# Patient Record
Sex: Female | Born: 1980 | Race: White | Hispanic: Yes | Smoking: Never smoker
Health system: Southern US, Community
[De-identification: ages and names within clinical notes are randomized; demographics above are authoritative.]

## PROBLEM LIST (undated history)

## (undated) HISTORY — PX: TUBAL LIGATION: SHX77

---
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
## 1998-01-07 ENCOUNTER — Other Ambulatory Visit: Admission: RE | Admit: 1998-01-07 | Discharge: 1998-01-07 | Payer: Self-pay | Admitting: Obstetrics and Gynecology

## 2000-09-12 ENCOUNTER — Other Ambulatory Visit: Admission: RE | Admit: 2000-09-12 | Discharge: 2000-09-12 | Payer: Self-pay | Admitting: Gynecology

## 2015-07-09 ENCOUNTER — Emergency Department
Admission: EM | Admit: 2015-07-09 | Discharge: 2015-07-09 | Disposition: A | Discharge status: Home / Self Care | Payer: BLUE CROSS/BLUE SHIELD | Source: Home / Self Care | Attending: Family Medicine | Admitting: Family Medicine

## 2015-07-09 ENCOUNTER — Encounter: Payer: Self-pay | Admitting: *Deleted

## 2015-07-09 DIAGNOSIS — R059 Cough, unspecified: Secondary | ICD-10-CM

## 2015-07-09 DIAGNOSIS — J019 Acute sinusitis, unspecified: Secondary | ICD-10-CM | POA: Diagnosis not present

## 2015-07-09 DIAGNOSIS — R05 Cough: Secondary | ICD-10-CM

## 2015-07-09 MED ORDER — AMOXICILLIN-POT CLAVULANATE 875-125 MG PO TABS
1.0000 | ORAL_TABLET | Freq: Two times a day (BID) | ORAL | Status: DC
Start: 2015-07-09 — End: 2015-11-20

## 2015-07-09 MED ORDER — BENZONATATE 100 MG PO CAPS
100.0000 mg | ORAL_CAPSULE | Freq: Three times a day (TID) | ORAL | Status: DC
Start: 2015-07-09 — End: 2015-11-20

## 2015-07-09 NOTE — Discharge Instructions (Signed)
You may take 400-600mg  Ibuprofen (Motrin) every 6-8 hours for fever and pain  Alternate with Tylenol  You may take 500mg  Tylenol every 4-6 hours as needed for fever and pain  Follow-up with your primary care provider next week for recheck of symptoms if not improving.  Be sure to drink plenty of fluids and rest, at least 8hrs of sleep a night, preferably more while you are sick. Return urgent care or go to closest ER if you cannot keep down fluids/signs of dehydration, fever not reducing with Tylenol, difficulty breathing/wheezing, stiff neck, worsening condition, or other concerns (see below)  Please take antibiotics as prescribed and be sure to complete entire course even if you start to feel better to ensure infection does not come back.   Cool Mist Vaporizers Vaporizers may help relieve the symptoms of a cough and cold. They add moisture to the air, which helps mucus to become thinner and less sticky. This makes it easier to breathe and cough up secretions. Cool mist vaporizers do not cause serious burns like hot mist vaporizers, which may also be called steamers or humidifiers. Vaporizers have not been proven to help with colds. You should not use a vaporizer if you are allergic to mold. HOME CARE INSTRUCTIONS  Follow the package instructions for the vaporizer.  Do not use anything other than distilled water in the vaporizer.  Do not run the vaporizer all of the time. This can cause mold or bacteria to grow in the vaporizer.  Clean the vaporizer after each time it is used.  Clean and dry the vaporizer well before storing it.  Stop using the vaporizer if worsening respiratory symptoms develop.   This information is not intended to replace advice given to you by your health care provider. Make sure you discuss any questions you have with your health care provider.   Document Released: 12/10/2003 Document Revised: 03/19/2013 Document Reviewed: 08/01/2012 Elsevier Interactive Patient  Education 2016 Else[employer]  Cough, Adult A cough helps to clear your throat and lungs. A cough may last only 2-3 weeks (acute), or it may last longer than 8 weeks (chronic). Many different things can cause a cough. A cough may be a sign of an illness or another medical condition. HOME CARE  Pay attention to any changes in your cough.  Take medicines only as told by your doctor.  If you were prescribed an antibiotic medicine, take it as told by your doctor. Do not stop taking it even if you start to feel better.  Talk with your doctor before you try using a cough medicine.  Drink enough fluid to keep your pee (urine) clear or pale yellow.  If the air is dry, use a cold steam vaporizer or humidifier in your home.  Stay away from things that make you cough at work or at home.  If your cough is worse at night, try using extra pillows to raise your head up higher while you sleep.  Do not smoke, and try not to be around smoke. If you need help quitting, ask your doctor.  Do not have caffeine.  Do not drink alcohol.  Rest as needed. GET HELP IF:  You have new problems (symptoms).  You cough up yellow fluid (pus).  Your cough does not get better after 2-3 weeks, or your cough gets worse.  Medicine does not help your cough and you are not sleeping well.  You have pain that gets worse or pain that is not helped with medicine.  You have a fever.  You are losing weight and you do not know why.  You have night sweats. GET HELP RIGHT AWAY IF:  You cough up blood.  You have trouble breathing.  Your heartbeat is very fast.   This information is not intended to replace advice given to you by your health care provider. Make sure you discuss any questions you have with your health care provider.   Document Released: 11/25/2010 Document Revised: 12/03/2014 Document Reviewed: 05/21/2014 Elsevier Interactive Patient Education [employer].

## 2015-07-09 NOTE — ED Notes (Signed)
Pt c/o 1 week of dry cough, sinus pressure and ear pain. Afebrile. Taken Advil and Theraflu otc.

## 2015-07-09 NOTE — ED Provider Notes (Signed)
CSN: [id number]     Arrival date & time 07/09/15  1340 History   First MD Initiated Contact with Patient 07/09/15 1348     Chief Complaint  Patient presents with  . Cough  . Sinus Problem   (Consider location/radiation/quality/duration/timing/severity/associated sxs/prior Treatment) HPI  The pt is a 35yo female presenting to [facility] with c/o gradually worsening sinus pain and pressure with sinus congestion, Right ear pain, and dry cough that is moderate in severity, worse at night.  She has been taking Advil and Theraflu with only minimal relief.  Pt is off on Spring Break, trying to rest but not getting better. Denies fever, chills, n/v/d. No sick contacts or recent travel. Denies SOB. No hx of asthma.   History reviewed. No pertinent past medical history. Past Surgical History  Procedure Laterality Date  . Tubal ligation     Family History  Problem Relation Age of Onset  . Cancer Mother     cervical CA   Social History  Substance Use Topics  . Smoking status: Never Smoker   . Smokeless tobacco: Never Used  . Alcohol Use: Yes   OB History    No data available     Review of Systems  Constitutional: Positive for fatigue. Negative for fever and chills.  HENT: Positive for congestion, ear pain ( Right), postnasal drip, rhinorrhea, sinus pressure and sore throat. Negative for trouble swallowing and voice change.   Respiratory: Positive for cough. Negative for shortness of breath.   Cardiovascular: Negative for chest pain and palpitations.  Gastrointestinal: Negative for nausea, vomiting, abdominal pain and diarrhea.  Musculoskeletal: Negative for myalgias, back pain and arthralgias.  Skin: Negative for rash.  Neurological: Positive for headaches. Negative for dizziness and light-headedness.    Allergies  Codeine and Sulfur  Home Medications   Prior to Admission medications   Medication Sig Start Date End Date Taking? Authorizing Provider  amoxicillin-clavulanate (AUGMENTIN)  875-125 MG tablet Take 1 tablet by mouth 2 (two) times daily. One po bid x 7 days 07/09/15   Noland Fordyce, PA-C  benzonatate (TESSALON) 100 MG capsule Take 1-2 capsules (100-200 mg total) by mouth every 8 (eight) hours. 07/09/15   Noland Fordyce, PA-C   Meds Ordered and Administered this Visit  Medications - No data to display  BP 151/97 mmHg  Pulse 88  Temp(Src) 97.5 F (36.4 C) (Oral)  Resp 18  Ht 5' (1.524 m)  Wt 199 lb (90.266 kg)  BMI 38.86 kg/m2  SpO2 100%  LMP 06/02/2015 No data found.   Physical Exam  Constitutional: She appears well-developed and well-nourished. No distress.  HENT:  Head: Normocephalic and atraumatic.  Right Ear: Tympanic membrane is injected. A middle ear effusion is present.  Left Ear: Tympanic membrane normal.  Nose: Mucosal edema present. Right sinus exhibits maxillary sinus tenderness and frontal sinus tenderness. Left sinus exhibits maxillary sinus tenderness and frontal sinus tenderness.  Mouth/Throat: Uvula is midline, oropharynx is clear and moist and mucous membranes are normal.  Eyes: Conjunctivae are normal. No scleral icterus.  Neck: Normal range of motion. Neck supple.  Hoarse voice, no stridor.  Cardiovascular: Normal rate, regular rhythm and normal heart sounds.   Pulmonary/Chest: Effort normal and breath sounds normal. No stridor. No respiratory distress. She has no wheezes. She has no rales.  Abdominal: Soft. She exhibits no distension. There is no tenderness.  Musculoskeletal: Normal range of motion.  Lymphadenopathy:    She has no cervical adenopathy.  Neurological: She is alert.  Skin:  Skin is warm and dry. She is not diaphoretic.  Nursing note and vitals reviewed.   ED Course  Procedures (including critical care time)  Labs Review Labs Reviewed - No data to display  Imaging Review No results found.     MDM   1. Acute rhinosinusitis   2. Cough    Pt c/o worsening sinus symptoms despite rest and OTC medications.  Sinus tenderness noted on exam with hoarse voice and nasal congestion.  Will treat for bacterial cause.  Rx: Augmentin and tessalon  Advised pt to use acetaminophen and ibuprofen as needed for fever and pain. Encouraged rest and fluids. Encouraged sinus rinses.  F/u with PCP in 7-10 days if not improving, sooner if worsening. Pt verbalized understanding and agreement with tx plan.     Noland Fordyce, PA-C 07/09/15 [phone]

## 2015-10-23 ENCOUNTER — Ambulatory Visit (INDEPENDENT_AMBULATORY_CARE_PROVIDER_SITE_OTHER): Payer: BLUE CROSS/BLUE SHIELD | Admitting: Sports Medicine

## 2015-10-23 ENCOUNTER — Encounter: Payer: Self-pay | Admitting: Sports Medicine

## 2015-10-23 DIAGNOSIS — E282 Polycystic ovarian syndrome: Secondary | ICD-10-CM | POA: Diagnosis not present

## 2015-10-23 DIAGNOSIS — Z0000 Encounter for general adult medical examination without abnormal findings: Secondary | ICD-10-CM

## 2015-10-23 DIAGNOSIS — R635 Abnormal weight gain: Secondary | ICD-10-CM | POA: Diagnosis not present

## 2015-10-23 DIAGNOSIS — E669 Obesity, unspecified: Secondary | ICD-10-CM | POA: Insufficient documentation

## 2015-10-23 MED ORDER — METFORMIN HCL ER 500 MG PO TB24: 500.0000 mg | ORAL_TABLET | Freq: Every day | ORAL | 11 refills | Status: DC

## 2015-10-23 MED ORDER — PHENTERMINE HCL 37.5 MG PO TABS: ORAL_TABLET | ORAL | 0 refills | Status: DC

## 2015-10-23 NOTE — Assessment & Plan Note (Signed)
Starting metformin 500 extended release, checking testosterone levels, we will recheck in one month.

## 2015-10-23 NOTE — Progress Notes (Signed)
  Subjective:    CC: Establish care.   HPI:  This is a pleasant 35 year old female with PCOS, she has no complaints with the exception of difficulty losing weight.  Obesity: Desires to start weight loss medication.  Past medical history, Surgical history, Family history not pertinant except as noted below, Social history, Allergies, and medications have been entered into the medical record, reviewed, and no changes needed.   Review of Systems: No headache, visual changes, nausea, vomiting, diarrhea, constipation, dizziness, abdominal pain, skin rash, fevers, chills, night sweats, swollen lymph nodes, weight loss, chest pain, body aches, joint swelling, muscle aches, shortness of breath, mood changes, visual or auditory hallucinations.  Objective:    General: Well Developed, well nourished, and in no acute distress.  Neuro: Alert and oriented x3, extra-ocular muscles intact, sensation grossly intact.  HEENT: Normocephalic, atraumatic, pupils equal round reactive to light, neck supple, no masses, no lymphadenopathy, thyroid nonpalpable.  Skin: Warm and dry, no rashes noted.  Cardiac: Regular rate and rhythm, no murmurs rubs or gallops.  Respiratory: Clear to auscultation bilaterally. Not using accessory muscles, speaking in full sentences.  Abdominal: Soft, nontender, nondistended, positive bowel sounds, no masses, no organomegaly.  Musculoskeletal: Shoulder, elbow, wrist, hip, knee, ankle stable, and with full range of motion.  Impression and Recommendations:    The patient was counselled, risk factors were discussed, anticipatory guidance given.  Annual physical exam Up-to-date on screening measures, recent blood work including lipid panel, TSH were normal. She was a bit microcytic, recently had an IUD placed, I'm going to add a bit of iron for the next few months.  Abnormal weight gain Starting phentermine, nutrition referral, return monthly for weight checks and refills.  PCOS  (polycystic ovarian syndrome) Starting metformin 500 extended release, checking testosterone levels, we will recheck in one month.

## 2015-10-23 NOTE — Assessment & Plan Note (Signed)
Up-to-date on screening measures, recent blood work including lipid panel, TSH were normal. She was a bit microcytic, recently had an IUD placed, I'm going to add a bit of iron for the next few months.

## 2015-10-23 NOTE — Assessment & Plan Note (Signed)
Starting phentermine, nutrition referral, return monthly for weight checks and refills.

## 2015-10-24 LAB — TESTOSTERONE: Testosterone: 44 ng/dL

## 2015-10-27 ENCOUNTER — Ambulatory Visit (INDEPENDENT_AMBULATORY_CARE_PROVIDER_SITE_OTHER): Payer: BLUE CROSS/BLUE SHIELD | Admitting: Sports Medicine

## 2015-10-27 ENCOUNTER — Encounter: Payer: Self-pay | Admitting: Sports Medicine

## 2015-10-27 DIAGNOSIS — L918 Other hypertrophic disorders of the skin: Secondary | ICD-10-CM | POA: Diagnosis not present

## 2015-10-27 NOTE — Assessment & Plan Note (Signed)
Large skin tag on the back was treated with cryotherapy, return in 2 weeks to consider repeat treatment.

## 2015-10-27 NOTE — Progress Notes (Signed)
  Subjective:    CC: Skin tag  HPI: This is a pleasant 35 year old female with a skin tag in the middle of her back, she desires that we remove it.  Past medical history, Surgical history, Family history not pertinant except as noted below, Social history, Allergies, and medications have been entered into the medical record, reviewed, and no changes needed.   Review of Systems: No fevers, chills, night sweats, weight loss, chest pain, or shortness of breath.   Objective:    General: Well Developed, well nourished, and in no acute distress.  Neuro: Alert and oriented x3, extra-ocular muscles intact, sensation grossly intact.  HEENT: Normocephalic, atraumatic, pupils equal round reactive to light, neck supple, no masses, no lymphadenopathy, thyroid nonpalpable.  Skin: Warm and dry, no rashes. Large skin tag in the low back Cardiac: Regular rate and rhythm, no murmurs rubs or gallops, no lower extremity edema.  Respiratory: Clear to auscultation bilaterally. Not using accessory muscles, speaking in full sentences.  Procedure:  Cryodestruction of low back skin tag Consent obtained and verified. Time-out conducted. Noted no overlying erythema, induration, or other signs of local infection. Completed without difficulty using Cryo-Gun. Advised to call if fevers/chills, erythema, induration, drainage, or persistent bleeding.  Impression and Recommendations:    Skin tag Large skin tag on the back was treated with cryotherapy, return in 2 weeks to consider repeat treatment.

## 2015-11-20 ENCOUNTER — Ambulatory Visit (INDEPENDENT_AMBULATORY_CARE_PROVIDER_SITE_OTHER): Payer: BLUE CROSS/BLUE SHIELD | Admitting: Sports Medicine

## 2015-11-20 ENCOUNTER — Encounter: Payer: Self-pay | Admitting: Sports Medicine

## 2015-11-20 DIAGNOSIS — R635 Abnormal weight gain: Secondary | ICD-10-CM | POA: Diagnosis not present

## 2015-11-20 MED ORDER — PHENTERMINE HCL 37.5 MG PO TABS: ORAL_TABLET | ORAL | 0 refills | Status: DC

## 2015-11-20 NOTE — Progress Notes (Signed)
  Subjective:    CC: Follow-up  HPI: Obesity: 6 pound weight loss after the first month on phentermine. She does note some weaning of effect at the end of the day but is unable to remove her to take her medicine later in the day, and when she breaks it in half she cannot take the second dose.  Skin tag: Some scarring at the cryotherapy site which will resolve.  Past medical history, Surgical history, Family history not pertinant except as noted below, Social history, Allergies, and medications have been entered into the medical record, reviewed, and no changes needed.   Review of Systems: No fevers, chills, night sweats, weight loss, chest pain, or shortness of breath.   Objective:    General: Well Developed, well nourished, and in no acute distress.  Neuro: Alert and oriented x3, extra-ocular muscles intact, sensation grossly intact.  HEENT: Normocephalic, atraumatic, pupils equal round reactive to light, neck supple, no masses, no lymphadenopathy, thyroid nonpalpable.  Skin: Warm and dry, no rashes. Cardiac: Regular rate and rhythm, no murmurs rubs or gallops, no lower extremity edema.  Respiratory: Clear to auscultation bilaterally. Not using accessory muscles, speaking in full sentences.  Impression and Recommendations:    Abnormal weight gain 7 pound weight loss after the first month, refilling medication.

## 2015-11-20 NOTE — Assessment & Plan Note (Signed)
7 pound weight loss after the first month, refilling medication.

## 2015-12-18 ENCOUNTER — Ambulatory Visit (INDEPENDENT_AMBULATORY_CARE_PROVIDER_SITE_OTHER): Payer: BLUE CROSS/BLUE SHIELD | Admitting: Sports Medicine

## 2015-12-18 ENCOUNTER — Encounter: Payer: Self-pay | Admitting: Sports Medicine

## 2015-12-18 DIAGNOSIS — R635 Abnormal weight gain: Secondary | ICD-10-CM | POA: Diagnosis not present

## 2015-12-18 DIAGNOSIS — I10 Essential (primary) hypertension: Secondary | ICD-10-CM | POA: Diagnosis not present

## 2015-12-18 MED ORDER — PHENTERMINE HCL 37.5 MG PO TABS: ORAL_TABLET | ORAL | 0 refills | Status: DC

## 2015-12-18 MED ORDER — LISINOPRIL 10 MG PO TABS: 10.0000 mg | ORAL_TABLET | Freq: Every day | ORAL | 3 refills | Status: DC

## 2015-12-18 NOTE — Assessment & Plan Note (Signed)
3 pound weight loss after the second month. Refilling medication, if she doesn't lose at least 5 more pounds we will add Topamax.

## 2015-12-18 NOTE — Assessment & Plan Note (Signed)
Starting lisinopril 10.

## 2015-12-18 NOTE — Progress Notes (Signed)
  Subjective:    CC: Follow-up  HPI: Abnormal weight gain: Only an additional 3 pound weight loss at the second month of phentermine. Agreeable to use Topamax if she doesn't lose sufficient weight by the next visit.  Benign central hypertension: Elevated on every 6 visit, no headaches, visual changes, chest pain, agreeable to start medication.  Past medical history:  Negative.  See flowsheet/record as well for more information.  Surgical history: Negative.  See flowsheet/record as well for more information.  Family history: Negative.  See flowsheet/record as well for more information.  Social history: Negative.  See flowsheet/record as well for more information.  Allergies, and medications have been entered into the medical record, reviewed, and no changes needed.   Review of Systems: No fevers, chills, night sweats, weight loss, chest pain, or shortness of breath.   Objective:    General: Well Developed, well nourished, and in no acute distress.  Neuro: Alert and oriented x3, extra-ocular muscles intact, sensation grossly intact.  HEENT: Normocephalic, atraumatic, pupils equal round reactive to light, neck supple, no masses, no lymphadenopathy, thyroid nonpalpable.  Skin: Warm and dry, no rashes. Cardiac: Regular rate and rhythm, no murmurs rubs or gallops, no lower extremity edema.  Respiratory: Clear to auscultation bilaterally. Not using accessory muscles, speaking in full sentences.  Impression and Recommendations:    Abnormal weight gain 3 pound weight loss after the second month. Refilling medication, if she doesn't lose at least 5 more pounds we will add Topamax.  Essential hypertension, benign Starting lisinopril 10.

## 2016-01-15 ENCOUNTER — Ambulatory Visit: Payer: Self-pay | Admitting: Sports Medicine

## 2016-02-03 ENCOUNTER — Ambulatory Visit (INDEPENDENT_AMBULATORY_CARE_PROVIDER_SITE_OTHER): Payer: BLUE CROSS/BLUE SHIELD | Admitting: Sports Medicine

## 2016-02-03 ENCOUNTER — Encounter: Payer: Self-pay | Admitting: Sports Medicine

## 2016-02-03 DIAGNOSIS — I10 Essential (primary) hypertension: Secondary | ICD-10-CM

## 2016-02-03 DIAGNOSIS — R635 Abnormal weight gain: Secondary | ICD-10-CM

## 2016-02-03 MED ORDER — PHENTERMINE HCL 37.5 MG PO TABS: ORAL_TABLET | ORAL | 0 refills | Status: DC

## 2016-02-03 MED ORDER — TOPIRAMATE 50 MG PO TABS: ORAL_TABLET | ORAL | 0 refills | Status: DC

## 2016-02-03 NOTE — Progress Notes (Signed)
  Subjective:    CC: Follow-up  HPI: Obesity: Loss to few pounds, agreeable to add Topamax.  Benign essential hypertension: Controlled with lisinopril 10  Past medical history:  Negative.  See flowsheet/record as well for more information.  Surgical history: Negative.  See flowsheet/record as well for more information.  Family history: Negative.  See flowsheet/record as well for more information.  Social history: Negative.  See flowsheet/record as well for more information.  Allergies, and medications have been entered into the medical record, reviewed, and no changes needed.   Review of Systems: No fevers, chills, night sweats, weight loss, chest pain, or shortness of breath.   Objective:    General: Well Developed, well nourished, and in no acute distress.  Neuro: Alert and oriented x3, extra-ocular muscles intact, sensation grossly intact.  HEENT: Normocephalic, atraumatic, pupils equal round reactive to light, neck supple, no masses, no lymphadenopathy, thyroid nonpalpable.  Skin: Warm and dry, no rashes. Cardiac: Regular rate and rhythm, no murmurs rubs or gallops, no lower extremity edema.  Respiratory: Clear to auscultation bilaterally. Not using accessory muscles, speaking in full sentences.  Impression and Recommendations:    Essential hypertension, benign Well-controlled on lisinopril 10.  Abnormal weight gain Only a couple additional pounds after the third month, adding Topamax.   I spent 25 minutes with this patient, greater than 50% was face-to-face time counseling regarding the above diagnoses

## 2016-02-03 NOTE — Assessment & Plan Note (Signed)
Only a couple additional pounds after the third month, adding Topamax.

## 2016-02-03 NOTE — Assessment & Plan Note (Signed)
Well controlled on lisinopril 10

## 2016-02-09 ENCOUNTER — Telehealth: Payer: Self-pay

## 2016-02-09 NOTE — Telephone Encounter (Signed)
Rose Brooks states she was seen last week is still sick. She has pressure in face, nasal congestion, headaches, cough, fever and chills. She was treated with amoxicillin in September. She is taking Flonase and Theraflu. Please advise.

## 2016-02-10 NOTE — Telephone Encounter (Signed)
Where was she treated with the amoxicillin? What is one of the urgent cares? I probably need to take another look at her specifically to reevaluate her symptoms.

## 2016-02-11 NOTE — Telephone Encounter (Signed)
Left message advising of recommendations.  

## 2016-03-04 ENCOUNTER — Ambulatory Visit (INDEPENDENT_AMBULATORY_CARE_PROVIDER_SITE_OTHER): Payer: BLUE CROSS/BLUE SHIELD | Admitting: Sports Medicine

## 2016-03-04 ENCOUNTER — Encounter: Payer: Self-pay | Admitting: Sports Medicine

## 2016-03-04 DIAGNOSIS — R635 Abnormal weight gain: Secondary | ICD-10-CM

## 2016-03-04 MED ORDER — TOPIRAMATE 50 MG PO TABS: 50.0000 mg | ORAL_TABLET | Freq: Every day | ORAL | 0 refills | Status: DC

## 2016-03-04 MED ORDER — PHENTERMINE HCL 37.5 MG PO TABS: ORAL_TABLET | ORAL | 0 refills | Status: DC

## 2016-03-04 NOTE — Progress Notes (Signed)
  Subjective:    CC: Follow-up  HPI: This pleasant 35 year old female had somewhat stalled out on her weight loss, we added Topamax and she has dropped an additional 10 pounds.  Past medical history:  Negative.  See flowsheet/record as well for more information.  Surgical history: Negative.  See flowsheet/record as well for more information.  Family history: Negative.  See flowsheet/record as well for more information.  Social history: Negative.  See flowsheet/record as well for more information.  Allergies, and medications have been entered into the medical record, reviewed, and no changes needed.   Review of Systems: No fevers, chills, night sweats, weight loss, chest pain, or shortness of breath.   Objective:    General: Well Developed, well nourished, and in no acute distress.  Neuro: Alert and oriented x3, extra-ocular muscles intact, sensation grossly intact.  HEENT: Normocephalic, atraumatic, pupils equal round reactive to light, neck supple, no masses, no lymphadenopathy, thyroid nonpalpable.  Skin: Warm and dry, no rashes. Cardiac: Regular rate and rhythm, no murmurs rubs or gallops, no lower extremity edema.  Respiratory: Clear to auscultation bilaterally. Not using accessory muscles, speaking in full sentences.  Impression and Recommendations:    Abnormal weight gain Fantastic improvement in weight loss with 10 pounds after the last month simply with the addition of Topamax. Refilling phentermine and Topamax as we enter the fifth month.

## 2016-03-04 NOTE — Assessment & Plan Note (Signed)
Fantastic improvement in weight loss with 10 pounds after the last month simply with the addition of Topamax. Refilling phentermine and Topamax as we enter the fifth month.

## 2016-03-15 ENCOUNTER — Encounter: Payer: Self-pay | Admitting: Sports Medicine

## 2016-03-15 ENCOUNTER — Ambulatory Visit (INDEPENDENT_AMBULATORY_CARE_PROVIDER_SITE_OTHER): Payer: BLUE CROSS/BLUE SHIELD

## 2016-03-15 ENCOUNTER — Ambulatory Visit (INDEPENDENT_AMBULATORY_CARE_PROVIDER_SITE_OTHER): Payer: BLUE CROSS/BLUE SHIELD | Admitting: Sports Medicine

## 2016-03-15 DIAGNOSIS — J3089 Other allergic rhinitis: Secondary | ICD-10-CM | POA: Insufficient documentation

## 2016-03-15 DIAGNOSIS — M542 Cervicalgia: Secondary | ICD-10-CM

## 2016-03-15 MED ORDER — AZITHROMYCIN 250 MG PO TABS: ORAL_TABLET | ORAL | 0 refills | Status: DC

## 2016-03-15 MED ORDER — AZELASTINE HCL 0.1 % NA SOLN: 2.0000 | Freq: Two times a day (BID) | NASAL | 1 refills | Status: DC

## 2016-03-15 MED ORDER — FEXOFENADINE HCL 180 MG PO TABS: 180.0000 mg | ORAL_TABLET | Freq: Every day | ORAL | 3 refills | Status: DC

## 2016-03-15 NOTE — Progress Notes (Signed)
  Subjective:    CC: Sinus congestion  HPI: For the past several months this pleasant 35 year old female has had a right nose, and now new onset sinus pressure, she has significant postnasal drip with a sore throat. Symptoms are moderate, persistent. No fevers, chills, night sweats, weight loss, no GI symptoms, no rash, no lower respiratory symptoms.  She also has some neck pain, left trapezial.  Past medical history:  Negative.  See flowsheet/record as well for more information.  Surgical history: Negative.  See flowsheet/record as well for more information.  Family history: Negative.  See flowsheet/record as well for more information.  Social history: Negative.  See flowsheet/record as well for more information.  Allergies, and medications have been entered into the medical record, reviewed, and no changes needed.   Review of Systems: No fevers, chills, night sweats, weight loss, chest pain, or shortness of breath.   Objective:    General: Well Developed, well nourished, and in no acute distress.  Neuro: Alert and oriented x3, extra-ocular muscles intact, sensation grossly intact.  HEENT: Normocephalic, atraumatic, pupils equal round reactive to light, neck supple, no masses, no lymphadenopathy, thyroid nonpalpable. Oropharynx and ear canals are unremarkable, nasopharynx is boggy and erythematous turbinate. Skin: Warm and dry, no rashes. Cardiac: Regular rate and rhythm, no murmurs rubs or gallops, no lower extremity edema.  Respiratory: Clear to auscultation bilaterally. Not using accessory muscles, speaking in full sentences.  Impression and Recommendations:    Perennial allergic rhinitis Continue Flonase, adding nasal Azelastine, switched to fexofenadine and discontinue decongestants. She does have some frontal sinusitis at this point, adding azithromycin.  Neck pain Unilateral and left-sided across trapezius. There is likely an element of cervical spasm. Over-the-counter  analgesics, NSAIDs, and adding neck rehabilitation exercises as well as neck x-rays.  Return in 4 weeks for this.  I spent 25 minutes with this patient, greater than 50% was face-to-face time counseling regarding the above diagnoses

## 2016-03-15 NOTE — Assessment & Plan Note (Signed)
Continue Flonase, adding nasal Azelastine, switched to fexofenadine and discontinue decongestants. She does have some frontal sinusitis at this point, adding azithromycin.

## 2016-03-15 NOTE — Assessment & Plan Note (Signed)
Unilateral and left-sided across trapezius. There is likely an element of cervical spasm. Over-the-counter analgesics, NSAIDs, and adding neck rehabilitation exercises as well as neck x-rays.  Return in 4 weeks for this.

## 2016-04-01 ENCOUNTER — Ambulatory Visit (INDEPENDENT_AMBULATORY_CARE_PROVIDER_SITE_OTHER): Payer: BLUE CROSS/BLUE SHIELD | Admitting: Sports Medicine

## 2016-04-01 ENCOUNTER — Encounter: Payer: Self-pay | Admitting: Sports Medicine

## 2016-04-01 DIAGNOSIS — R635 Abnormal weight gain: Secondary | ICD-10-CM

## 2016-04-01 DIAGNOSIS — F4323 Adjustment disorder with mixed anxiety and depressed mood: Secondary | ICD-10-CM | POA: Diagnosis not present

## 2016-04-01 DIAGNOSIS — M542 Cervicalgia: Secondary | ICD-10-CM | POA: Diagnosis not present

## 2016-04-01 MED ORDER — BUPROPION HCL ER (XL) 150 MG PO TB24: 150.0000 mg | ORAL_TABLET | Freq: Every day | ORAL | 3 refills | Status: DC

## 2016-04-01 MED ORDER — MELOXICAM 15 MG PO TABS: ORAL_TABLET | ORAL | 3 refills | Status: DC

## 2016-04-01 MED ORDER — ALPRAZOLAM 0.5 MG PO TABS: 0.5000 mg | ORAL_TABLET | Freq: Two times a day (BID) | ORAL | 0 refills | Status: DC | PRN

## 2016-04-01 MED ORDER — PHENTERMINE HCL 37.5 MG PO TABS: ORAL_TABLET | ORAL | 0 refills | Status: DC

## 2016-04-01 MED ORDER — TOPIRAMATE 50 MG PO TABS: 50.0000 mg | ORAL_TABLET | Freq: Every day | ORAL | 0 refills | Status: DC

## 2016-04-01 MED ORDER — MELOXICAM 15 MG PO TABS
ORAL_TABLET | ORAL | 3 refills | Status: DC
Start: 2016-04-01 — End: 2016-06-29

## 2016-04-01 NOTE — Assessment & Plan Note (Signed)
Husband is leaving her. Tearful in the exam room, having episodes of panic. Adding Wellbutrin to assist with weight loss as well as depressive symptoms and alprazolam 0.5 mg, no more than 20 pills per month.

## 2016-04-01 NOTE — Progress Notes (Signed)
  Subjective:    CC: follow-up multiple issues  HPI: Abnormal weight gain: As we entered the sixth month of phentermine she has continued to lose weight.  Neck pain:  Agreeable to proceed with physical therapy and switch to meloxicam, pain is axial, nothing overtly radicular, no constitutional symptoms or trauma.  Adjustment disorder: Just found out that her husband leaving her, she now has expected anxiety and depressed mood, with severe depressed mood, severe difficulty staying asleep, poor appetite, moderate double to concentrating, severe psychomotor retardation, mild anhedonia, poor energy, guilt, no suicidal or homicidal ideation, edition she has severe nervousness, difficulty controlling her worry, worrying about different things, trouble relaxing, restlessness, irritability and fear of impending doom.  Past medical history:  Negative.  See flowsheet/record as well for more information.  Surgical history: Negative.  See flowsheet/record as well for more information.  Family history: Negative.  See flowsheet/record as well for more information.  Social history: Negative.  See flowsheet/record as well for more information.  Allergies, and medications have been entered into the medical record, reviewed, and no changes needed.   Review of Systems: No fevers, chills, night sweats, weight loss, chest pain, or shortness of breath.   Objective:    General: Well Developed, well nourished, and in no acute distress.  Neuro: Alert and oriented x3, extra-ocular muscles intact, sensation grossly intact.  HEENT: Normocephalic, atraumatic, pupils equal round reactive to light, neck supple, no masses, no lymphadenopathy, thyroid nonpalpable.  Skin: Warm and dry, no rashes. Cardiac: Regular rate and rhythm, no murmurs rubs or gallops, no lower extremity edema.  Respiratory: Clear to auscultation bilaterally. Not using accessory muscles, speaking in full sentences.  Impression and Recommendations:      Abnormal weight gain Good continued weight loss. We are entering the sixth month, continue phentermine, Topamax.  Neck pain Adding meloxicam and physical therapy. If no sufficient improvement in 2 months we will add an MRI.  Adjustment disorder with mixed anxiety and depressed mood Husband is leaving her. Tearful in the exam room, having episodes of panic. Adding Wellbutrin to assist with weight loss as well as depressive symptoms and alprazolam 0.5 mg, no more than 20 pills per month.

## 2016-04-01 NOTE — Assessment & Plan Note (Signed)
Adding meloxicam and physical therapy. If no sufficient improvement in 2 months we will add an MRI.

## 2016-04-01 NOTE — Assessment & Plan Note (Signed)
Good continued weight loss. We are entering the sixth month, continue phentermine, Topamax.

## 2016-04-06 ENCOUNTER — Encounter: Payer: Self-pay | Admitting: Physical Therapy

## 2016-04-06 ENCOUNTER — Ambulatory Visit (INDEPENDENT_AMBULATORY_CARE_PROVIDER_SITE_OTHER): Payer: BLUE CROSS/BLUE SHIELD | Admitting: Physical Therapy

## 2016-04-06 DIAGNOSIS — M6281 Muscle weakness (generalized): Secondary | ICD-10-CM

## 2016-04-06 DIAGNOSIS — G8929 Other chronic pain: Secondary | ICD-10-CM

## 2016-04-06 DIAGNOSIS — M542 Cervicalgia: Secondary | ICD-10-CM

## 2016-04-06 NOTE — Therapy (Addendum)
[place], [place], [address] Phone: [phone]   Fax:  [phone]  Physical Therapy Evaluation  Patient Details  Name: Rose Brooks MRN: [id number] Date of Birth: 1980/06/16 Referring Provider: Dr Dianah Field  Encounter Date: 04/06/2016      PT End of Session - 04/06/16 3614    Visit Number 1   Number of Visits 8  6-   Date for PT Re-Evaluation 05/04/16   PT Start Time 0713   PT Stop Time 0817   PT Time Calculation (min) 64 min   Activity Tolerance Patient tolerated treatment well      History reviewed. No pertinent past medical history.  Past Surgical History:  Procedure Laterality Date  . TUBAL LIGATION      There were no vitals filed for this visit.       Subjective Assessment - 04/06/16 0713    Subjective Pt reports she started having neck and back pain about a year ago.  Recently it has gotten worse and there are at least two times a week that she can't lie doen due to pain. She has been performing stretching and gentle exercise since before Christmas and hasn't seen any improvement.    Diagnostic tests x-ray - degenerative changes.    Patient Stated Goals limit meds she needs to be on, looking for stretches/exercise to help manage the pain and sleep better at night.    Currently in Pain? Yes   Pain Score 4    Pain Location Neck   Pain Orientation Left   Pain Descriptors / Indicators --  stiffness   Pain Type Chronic pain   Pain Radiating Towards starts in the shoulder blade area Lt and travels up the neck    Pain Onset More than a month ago   Pain Frequency Constant   Aggravating Factors  gets worse through the day,    Pain Relieving Factors heat            [facility] PT Assessment - 04/06/16 0001      Assessment   Medical Diagnosis Neck pain   Referring Provider Dr Dianah Field   Onset Date/Surgical Date 04/07/15   Hand Dominance Right   Next MD Visit 05/05/16    Prior Therapy no     Precautions   Precautions None     Balance Screen   Has the patient fallen in the past 6 months No     Home Environment   Additional Comments pt with a lot of stress, husband is leaving her     Prior Function   Level of Independence Independent   Vocation Full time employment   Vocation Requirements works at [employer], has a Counsellor, moves around   Leisure works with a Clinical research associate 2x/wk and to [employer] 2x/wk, hiking, fishing     Observation/Other Assessments   Focus on Therapeutic Outcomes (FOTO)  63% limited     Posture/Postural Control   Posture/Postural Control Postural limitations   Postural Limitations Rounded Shoulders;Forward head     ROM / Strength   AROM / PROM / Strength AROM;Strength     AROM   AROM Assessment Site Cervical;Shoulder   Right/Left Shoulder --  bilat WNL pain in Lt side neck   Cervical Flexion WNL with pain Lt side neck   Cervical Extension WNL pain Lt side neck   Cervical - Right Side Bend decreased 25% compared to Lt   Cervical - Left Side Bend WNL  Cervical - Right Rotation WNL pain Lt side   Cervical - Left Rotation WNL pain Lt side     Strength   Overall Strength Comments mid back Rt WNL, Lt 4/5   Strength Assessment Site Shoulder;Elbow   Right/Left Shoulder --  bilat WNL   Right/Left Elbow --  bilat WNL     Flexibility   Soft Tissue Assessment /Muscle Length --  tight pecs bilat     Palpation   Palpation comment tightness and multiple trigger points in Lt cervical , upper trap, levator and some into the rhomboids     Special Tests    Special Tests --  ( -) spurlings                    [facility] Adult PT Treatment/Exercise - 04/06/16 0001      Self-Care   Self-Care Other Self-Care Comments   Other Self-Care Comments  Pt educated on suboccipital release with balls, as well as self massage with ball to Lt shoulder girdle. Pt verbalized understanding and returned demo.      Exercises    Exercises Neck;Shoulder     Neck Exercises: Supine   Neck Retraction 10 reps;5 secs  (head press into pillow)      Shoulder Exercises: Supine   Horizontal ABduction Strengthening;Both;15 reps;Theraband   Theraband Level (Shoulder Horizontal ABduction) Level 3 (Green)     Shoulder Exercises: Standing   Other Standing Exercises scap squeeze x 5 sec x 5 reps.      Shoulder Exercises: Stretch   Other Shoulder Stretches Doorway stretch - low and mid level (unilateral) x 30 sec x 2 reps each arm, each position      Modalities   Modalities Electrical Stimulation;Moist Heat     Moist Heat Therapy   Number Minutes Moist Heat 15 Minutes   Moist Heat Location Shoulder;Cervical     Electrical Stimulation   Electrical Stimulation Location Lt cervical paraspinals/ Upper trap/levator   Electrical Stimulation Action IFC   Electrical Stimulation Parameters  to tolerance    Electrical Stimulation Goals Pain;Tone                PT Education - 04/06/16 0745    Education provided Yes   Education Details HEP, DN    Person(s) Educated Patient   Methods Demonstration;Explanation;Handout   Comprehension Returned demonstration;Verbalized understanding             PT Long Term Goals - 04/06/16 0746      PT LONG TERM GOAL #1   Title I with advanced HEP to include exercise at the gym ( 05/04/16)    Time 4   Period Weeks   Status New     PT LONG TERM GOAL #2   Title demo pain free cervical and shoulder ROM ( 05/04/16)    Time 4   Period Weeks   Status New     PT LONG TERM GOAL #3   Title demo mid back strength = bilat ( 05/04/16)    Time 4   Period Weeks   Status New     PT LONG TERM GOAL #4   Title report =/> 75% reduction in neck/thoracic pain to allow her to sleep per her previous level ( 05/04/16)    Time 4   Period Weeks   Status New     PT LONG TERM GOAL #5   Title improve FOTO =/< 43% limited ( 05/04/16)    Time 4   Period  Weeks   Status New                Plan - 04/06/16 0741    Clinical Impression Statement 36 yo female with long h/o neck and thoracic pain that has gotten worse over the last couple of months as her personal life has become more stressful.  She works out 4 times a week, two of those with a Physiological scientist.  She does have some postural changes that place stress on the neck/shoulder, multiple trigger points/muscle tightness and some Lt sided upper back weakness as well as painful movements.  She wishes to learn what she can and transition to a HEP as soon as possible.     Rehab Potential Good   PT Frequency 2x / week  for two weeks then 1-2x/wk for 2 more weeks   PT Treatment/Interventions Moist Heat;Traction;Ultrasound;Therapeutic exercise;Dry needling;Taping;Manual techniques;Neuromuscular re-education;Cryotherapy;Electrical Stimulation;Patient/family education;Passive range of motion   PT Next Visit Plan DN if pt is interested, progress posture correction ex, manual work, modalites PRN   Consulted and Agree with Plan of Care Patient      Patient will benefit from skilled therapeutic intervention in order to improve the following deficits and impairments:  Pain, Decreased strength, Increased muscle spasms  Visit Diagnosis: Chronic neck pain - Plan: PT plan of care cert/re-cert  Muscle weakness (generalized) - Plan: PT plan of care cert/re-cert     Problem List Patient Active Problem List   Diagnosis Date Noted  . Adjustment disorder with mixed anxiety and depressed mood 04/01/2016  . Perennial allergic rhinitis 03/15/2016  . Neck pain 03/15/2016  . Essential hypertension, benign 12/18/2015  . Annual physical exam 10/23/2015  . PCOS (polycystic ovarian syndrome) 10/23/2015  . Abnormal weight gain 10/23/2015    Jeral Pinch PT  04/06/2016, 9:01 AM  [facility] [place] [address], [place], [address] Phone: [phone]   Fax:  [phone]  Name:  Rose Brooks MRN: [id number] Date of Birth: 04-Oct-1980 PHYSICAL THERAPY DISCHARGE SUMMARY  Visits from Start of Care: 1  Current functional level related to goals / functional outcomes: unknown   Remaining deficits: unknown   Education / Equipment: Initial HEP Plan:                                                    Patient goals were not met. Patient is being discharged due to not returning since the last visit.  ?????     Jeral Pinch, PT 06/16/16 9:46 AM

## 2016-04-06 NOTE — Patient Instructions (Addendum)
Trigger Point Dry Needling  . What is Trigger Point Dry Needling (DN)? o DN is a physical therapy technique used to treat muscle pain and dysfunction. Specifically, DN helps deactivate muscle trigger points (muscle knots).  o A thin filiform needle is used to penetrate the skin and stimulate the underlying trigger point. The goal is for a local twitch response (LTR) to occur and for the trigger point to relax. No medication of any kind is injected during the procedure.   . What Does Trigger Point Dry Needling Feel Like?  o The procedure feels different for each individual patient. Some patients report that they do not actually feel the needle enter the skin and overall the process is not painful. Very mild bleeding may occur. However, many patients feel a deep cramping in the muscle in which the needle was inserted. This is the local twitch response.   Marland Kitchen How Will I feel after the treatment? o Soreness is normal, and the onset of soreness may not occur for a few hours. Typically this soreness does not last longer than two days.  o Bruising is uncommon, however; ice can be used to decrease any possible bruising.  o In rare cases feeling tired or nauseous after the treatment is normal. In addition, your symptoms may get worse before they get better, this period will typically not last longer than 24 hours.   . What Can I do After My Treatment? o Increase your hydration by drinking more water for the next 24 hours. o You may place ice or heat on the areas treated that have become sore, however, do not use heat on inflamed or bruised areas. Heat often brings more relief post needling. o You can continue your regular activities, but vigorous activity is not recommended initially after the treatment for 24 hours. o DN is best combined with other physical therapy such as strengthening, stretching, and other therapies.   Head Press With [place] chin SLIGHTLY toward chest, keep mouth closed.  Feel weight on back of head. Increase weight by pressing head down. Hold _5__ seconds. Relax. Repeat _10__ times.  Side Pull: Double Arm   On back, knees bent, feet flat. Arms perpendicular to body, shoulder level, elbows straight but relaxed. Pull arms out to sides, elbows straight. Resistance band comes across collarbones, hands toward floor. Hold momentarily. Slowly return to starting position. Repeat __10_ times, 2-3 sets, 3x/wk. Band color _green____   Scapula Adduction With Pectorals, Low   Stand in doorframe with palms against frame and arms at 45. Lean forward and squeeze shoulder blades. Hold _30__ seconds. Repeat __2_ times per session. Do _2-3__ sessions per day.   Scapula Adduction With Pectorals, Mid-Range   Stand in doorframe with palms against frame and arms at 90. Lean forward and squeeze shoulder blades. Hold _30__ seconds. Repeat _2__ times per session. Do _2-3__ sessions per day.  * self massage  With ball to shoulder blade area 3-5 min daily.  * occipital release - tennis balls (in panty hose) to back of neck/head x 1 min, work up to a couple of minutes, as tolerated.    [facility] [place], [place] [address]  [phone] (office) [phone] (fax)

## 2016-04-08 ENCOUNTER — Encounter: Payer: Self-pay | Admitting: Physical Therapy

## 2016-04-12 ENCOUNTER — Encounter: Payer: Self-pay | Admitting: Rehabilitative and Restorative Service Providers"

## 2016-04-15 ENCOUNTER — Encounter: Payer: Self-pay | Admitting: Physical Therapy

## 2016-04-28 ENCOUNTER — Encounter: Payer: Self-pay | Admitting: Sports Medicine

## 2016-04-28 ENCOUNTER — Ambulatory Visit (INDEPENDENT_AMBULATORY_CARE_PROVIDER_SITE_OTHER): Payer: BLUE CROSS/BLUE SHIELD | Admitting: Sports Medicine

## 2016-04-28 VITALS — BP 137/89 | HR 105 | Ht 60.0 in | Wt 157.0 lb

## 2016-04-28 DIAGNOSIS — Z7251 High risk heterosexual behavior: Secondary | ICD-10-CM

## 2016-04-28 DIAGNOSIS — M542 Cervicalgia: Secondary | ICD-10-CM

## 2016-04-28 DIAGNOSIS — I10 Essential (primary) hypertension: Secondary | ICD-10-CM

## 2016-04-28 DIAGNOSIS — F4323 Adjustment disorder with mixed anxiety and depressed mood: Secondary | ICD-10-CM

## 2016-04-28 DIAGNOSIS — E282 Polycystic ovarian syndrome: Secondary | ICD-10-CM

## 2016-04-28 DIAGNOSIS — R635 Abnormal weight gain: Secondary | ICD-10-CM

## 2016-04-28 LAB — HEPATITIS PANEL, ACUTE
HCV Ab: NEGATIVE
Hep A IgM: NONREACTIVE
Hep B C IgM: NONREACTIVE
Hepatitis B Surface Ag: NEGATIVE

## 2016-04-28 LAB — WET PREP FOR TRICH, YEAST, CLUE
Clue Cells Wet Prep HPF POC: NONE SEEN
Trich, Wet Prep: NONE SEEN
Yeast Wet Prep HPF POC: NONE SEEN

## 2016-04-28 MED ORDER — LISINOPRIL 10 MG PO TABS: 10.0000 mg | ORAL_TABLET | Freq: Every day | ORAL | 3 refills | Status: DC

## 2016-04-28 MED ORDER — BUPROPION HCL ER (XL) 300 MG PO TB24: 300.0000 mg | ORAL_TABLET | Freq: Every day | ORAL | 1 refills | Status: DC

## 2016-04-28 MED ORDER — PHENTERMINE HCL 37.5 MG PO TABS: ORAL_TABLET | ORAL | 0 refills | Status: DC

## 2016-04-28 MED ORDER — ALPRAZOLAM 0.5 MG PO TABS: 0.5000 mg | ORAL_TABLET | Freq: Two times a day (BID) | ORAL | 0 refills | Status: DC | PRN

## 2016-04-28 MED ORDER — TOPIRAMATE 50 MG PO TABS: 50.0000 mg | ORAL_TABLET | Freq: Every day | ORAL | 0 refills | Status: DC

## 2016-04-28 MED ORDER — METFORMIN HCL ER 500 MG PO TB24: 500.0000 mg | ORAL_TABLET | Freq: Every day | ORAL | 11 refills | Status: DC

## 2016-04-28 NOTE — Assessment & Plan Note (Signed)
Good continued weight loss however I think that some of her weight loss is related to her current adjustment disorder and depression. This is the juncture after 6 months of phentermine will be dropped one half tab, continue Topamax.

## 2016-04-28 NOTE — Assessment & Plan Note (Signed)
Neck pain has resolved with meloxicam and physical therapy.

## 2016-04-28 NOTE — Progress Notes (Signed)
  Subjective:    CC: Follow-up multiple issues  HPI:  Adjustment disorder: Persistent depressive and anxious symptoms, needs STD testing. Agreeable to go up on Wellbutrin. No suicidal or homicidal ideation  Obesity: Good continued weight loss after 6 months of phentermine and Topamax.  Neck pain: Resolved with physical therapy and anti-inflammatories.  Past medical history:  Negative.  See flowsheet/record as well for more information.  Surgical history: Negative.  See flowsheet/record as well for more information.  Family history: Negative.  See flowsheet/record as well for more information.  Social history: Negative.  See flowsheet/record as well for more information.  Allergies, and medications have been entered into the medical record, reviewed, and no changes needed.   Review of Systems: No fevers, chills, night sweats, weight loss, chest pain, or shortness of breath.   Objective:    General: Well Developed, well nourished, and in no acute distress.  Neuro: Alert and oriented x3, extra-ocular muscles intact, sensation grossly intact.  HEENT: Normocephalic, atraumatic, pupils equal round reactive to light, neck supple, no masses, no lymphadenopathy, thyroid nonpalpable.  Skin: Warm and dry, no rashes. Cardiac: Regular rate and rhythm, no murmurs rubs or gallops, no lower extremity edema.  Respiratory: Clear to auscultation bilaterally. Not using accessory muscles, speaking in full sentences.  Impression and Recommendations:    Adjustment disorder with mixed anxiety and depressed mood Needs a refill on alprazolam. Increasing Wellbutrin to 300 mg. She is going to start behavioral therapy. Return in one month.  Neck pain Neck pain has resolved with meloxicam and physical therapy.  Abnormal weight gain Good continued weight loss however I think that some of her weight loss is related to her current adjustment disorder and depression. This is the juncture after 6 months of  phentermine will be dropped one half tab, continue Topamax.

## 2016-04-28 NOTE — Assessment & Plan Note (Signed)
Needs a refill on alprazolam. Increasing Wellbutrin to 300 mg. She is going to start behavioral therapy. Return in one month.

## 2016-04-29 LAB — HIV ANTIBODY (ROUTINE TESTING W REFLEX): HIV 1&2 Ab, 4th Generation: NONREACTIVE

## 2016-04-29 LAB — GC/CHLAMYDIA PROBE AMP
CT Probe RNA: NOT DETECTED
GC Probe RNA: NOT DETECTED

## 2016-04-29 LAB — HSV(HERPES SIMPLEX VRS) I + II AB-IGG
HSV 1 Glycoprotein G Ab, IgG: <0.9 {index} (ref <0.90)
HSV 2 Glycoprotein G Ab, IgG: <0.9 {index} (ref <0.90)

## 2016-04-29 LAB — RPR

## 2016-05-03 ENCOUNTER — Other Ambulatory Visit: Payer: Self-pay | Admitting: Sports Medicine

## 2016-05-03 DIAGNOSIS — I10 Essential (primary) hypertension: Secondary | ICD-10-CM

## 2016-05-04 ENCOUNTER — Telehealth: Payer: Self-pay

## 2016-05-04 MED ORDER — TRAZODONE HCL 50 MG PO TABS: 25.0000 mg | ORAL_TABLET | Freq: Every evening | ORAL | 3 refills | Status: DC | PRN

## 2016-05-04 NOTE — Telephone Encounter (Signed)
Adding trazodone at bedtime which will also help mood and anxiety.

## 2016-05-04 NOTE — Telephone Encounter (Signed)
Pt states her lack of sleep is starting to slow down her reactions time and concentration. Has tried OTC remedies that are not helping. Would like to know what she can do at this point so that she can get sleep. Please assist.

## 2016-05-05 ENCOUNTER — Ambulatory Visit: Payer: Self-pay | Admitting: Sports Medicine

## 2016-05-05 NOTE — Telephone Encounter (Signed)
Left VM with information.  

## 2016-05-26 ENCOUNTER — Ambulatory Visit: Payer: Self-pay | Admitting: Sports Medicine

## 2016-06-24 ENCOUNTER — Other Ambulatory Visit: Payer: Self-pay | Admitting: Sports Medicine

## 2016-06-24 DIAGNOSIS — F4323 Adjustment disorder with mixed anxiety and depressed mood: Secondary | ICD-10-CM

## 2016-06-29 ENCOUNTER — Other Ambulatory Visit: Payer: Self-pay

## 2016-06-29 DIAGNOSIS — M542 Cervicalgia: Secondary | ICD-10-CM

## 2016-06-29 MED ORDER — MELOXICAM 15 MG PO TABS: ORAL_TABLET | ORAL | 1 refills | Status: DC

## 2016-06-30 ENCOUNTER — Other Ambulatory Visit: Payer: Self-pay | Admitting: Sports Medicine

## 2016-06-30 ENCOUNTER — Encounter: Payer: Self-pay | Admitting: Sports Medicine

## 2016-06-30 ENCOUNTER — Ambulatory Visit (INDEPENDENT_AMBULATORY_CARE_PROVIDER_SITE_OTHER): Payer: BLUE CROSS/BLUE SHIELD | Admitting: Sports Medicine

## 2016-06-30 DIAGNOSIS — R635 Abnormal weight gain: Secondary | ICD-10-CM

## 2016-06-30 DIAGNOSIS — F4323 Adjustment disorder with mixed anxiety and depressed mood: Secondary | ICD-10-CM

## 2016-06-30 DIAGNOSIS — I10 Essential (primary) hypertension: Secondary | ICD-10-CM | POA: Diagnosis not present

## 2016-06-30 MED ORDER — TOPIRAMATE 50 MG PO TABS: 50.0000 mg | ORAL_TABLET | Freq: Every day | ORAL | 0 refills | Status: DC

## 2016-06-30 MED ORDER — TRAZODONE HCL 100 MG PO TABS: 100.0000 mg | ORAL_TABLET | Freq: Every day | ORAL | 3 refills | Status: DC

## 2016-06-30 NOTE — Progress Notes (Signed)
  Subjective:    CC: Follow-up  HPI: This is a pleasant 36 year old female who returns for follow-up of anxiety and adjustment disorder. She is currently in the process of separation from her husband, has had significant anxiety, depression. We increased her Wellbutrin at the last visit 300 mg which provided some mild relief, she was still having difficulty sleeping so we added trazodone which is also provided good relief. She does feel as though she needs to go up on the dose of trazodone, she hasn't used is much alprazolam through the past month as she typically has. No suicidal or homicidal ideation.  Obesity: Good continued weight loss on her own.  Hypertension: Well controlled on recheck.  Past medical history:  Negative.  See flowsheet/record as well for more information.  Surgical history: Negative.  See flowsheet/record as well for more information.  Family history: Negative.  See flowsheet/record as well for more information.  Social history: Negative.  See flowsheet/record as well for more information.  Allergies, and medications have been entered into the medical record, reviewed, and no changes needed.   Review of Systems: No fevers, chills, night sweats, weight loss, chest pain, or shortness of breath.   Objective:    General: Well Developed, well nourished, and in no acute distress.  Neuro: Alert and oriented x3, extra-ocular muscles intact, sensation grossly intact.  HEENT: Normocephalic, atraumatic, pupils equal round reactive to light, neck supple, no masses, no lymphadenopathy, thyroid nonpalpable.  Skin: Warm and dry, no rashes. Cardiac: Regular rate and rhythm, no murmurs rubs or gallops, no lower extremity edema.  Respiratory: Clear to auscultation bilaterally. Not using accessory muscles, speaking in full sentences.  Impression and Recommendations:    Adjustment disorder with mixed anxiety and depressed mood Having some improvement, still going through a tough  time at home. Continue Wellbutrin 300 and increasing trazodone to 100 mg at bedtime.  Essential hypertension, benign Doing okay, rechecking. Continue lisinopril 10.  Abnormal weight gain Off of phentermine, continues to lose weight.

## 2016-06-30 NOTE — Assessment & Plan Note (Signed)
Having some improvement, still going through a tough time at home. Continue Wellbutrin 300 and increasing trazodone to 100 mg at bedtime.

## 2016-06-30 NOTE — Assessment & Plan Note (Signed)
Doing okay, rechecking. Continue lisinopril 10.

## 2016-06-30 NOTE — Assessment & Plan Note (Signed)
Off of phentermine, continues to lose weight.

## 2016-07-18 ENCOUNTER — Encounter: Payer: Self-pay | Admitting: Emergency Medicine

## 2016-07-18 ENCOUNTER — Emergency Department
Admission: EM | Admit: 2016-07-18 | Discharge: 2016-07-18 | Disposition: A | Discharge status: Home / Self Care | Payer: BLUE CROSS/BLUE SHIELD | Source: Home / Self Care | Attending: Emergency Medicine | Admitting: Emergency Medicine

## 2016-07-18 DIAGNOSIS — Z23 Encounter for immunization: Secondary | ICD-10-CM | POA: Diagnosis not present

## 2016-07-18 DIAGNOSIS — S61218A Laceration without foreign body of other finger without damage to nail, initial encounter: Secondary | ICD-10-CM

## 2016-07-18 MED ORDER — TETANUS-DIPHTHERIA TOXOIDS TD 5-2 LFU IM INJ
0.5000 mL | INJECTION | Freq: Once | INTRAMUSCULAR | Status: AC
  Administered 2016-07-18: 0.5 mL via INTRAMUSCULAR

## 2016-07-18 NOTE — Discharge Instructions (Addendum)
Keep wound clean and dry until Wed am.   Then, change dressing Wed am. Suture removal in 12-14 days. Return sooner if any problems.

## 2016-07-18 NOTE — ED Triage Notes (Addendum)
Pt states she cut her left index finger on a razorblade about 30 mins ago. Unsure of last Tdap.

## 2016-07-18 NOTE — ED Provider Notes (Signed)
[name]    CSN: [id number] Arrival date & time: 07/18/16  1504     History   Chief Complaint Chief Complaint  Patient presents with  . Laceration    HPI Rose Brooks is a 36 y.o. female.   The history is provided by the patient.  Laceration  Location:  Finger Finger laceration location:  L index finger Length:  3 cm Depth:  Through underlying tissue Quality: straight   Bleeding: controlled with pressure   Time since incident:  2 hours Laceration mechanism:  Razor (Was using a box cutter to open a box at home) Pain details:    Quality:  Sharp   Severity:  Moderate   Timing:  Constant   Progression:  Unchanged Foreign body present:  Unable to specify Relieved by:  Nothing Worsened by:  Pressure Tetanus status:  Out of date Associated symptoms: no fever, no focal weakness and no numbness   Associated symptoms comment:  Denies problems extending or flexing the finger   History reviewed. No pertinent past medical history.  Patient Active Problem List   Diagnosis Date Noted  . Adjustment disorder with mixed anxiety and depressed mood 04/01/2016  . Perennial allergic rhinitis 03/15/2016  . Neck pain 03/15/2016  . Essential hypertension, benign 12/18/2015  . Annual physical exam 10/23/2015  . PCOS (polycystic ovarian syndrome) 10/23/2015  . Abnormal weight gain 10/23/2015    Past Surgical History:  Procedure Laterality Date  . TUBAL LIGATION      OB History    No data available       Home Medications    Prior to Admission medications   Medication Sig Start Date End Date Taking? Authorizing Provider  ALPRAZolam Duanne Moron) 0.5 MG tablet TAKE 1 TABLET BY MOUTH TWICE A DAY AS NEEDED 06/30/16   Silverio Decamp, MD  azelastine (ASTELIN) 0.1 % nasal spray Place 2 sprays into both nostrils 2 (two) times daily. Use in each nostril as directed 03/15/16   Silverio Decamp, MD  buPROPion (WELLBUTRIN XL) 300 MG 24 hr tablet TAKE 1 TABLET (300  MG TOTAL) BY MOUTH DAILY. 06/27/16   Silverio Decamp, MD  fexofenadine (ALLEGRA) 180 MG tablet Take 1 tablet (180 mg total) by mouth daily. 03/15/16   Silverio Decamp, MD  lisinopril (PRINIVIL,ZESTRIL) 10 MG tablet take 1 tablet by mouth once daily 05/03/16   Silverio Decamp, MD  meloxicam (MOBIC) 15 MG tablet One tab PO qAM with breakfast for 2 weeks, then daily prn pain. 06/29/16   Silverio Decamp, MD  metFORMIN (GLUCOPHAGE XR) 500 MG 24 hr tablet Take 1 tablet (500 mg total) by mouth daily with breakfast. 04/28/16   Silverio Decamp, MD  topiramate (TOPAMAX) 50 MG tablet Take 1 tablet (50 mg total) by mouth daily. 06/30/16   Silverio Decamp, MD  traZODone (DESYREL) 100 MG tablet Take 1 tablet (100 mg total) by mouth at bedtime. 06/30/16   Silverio Decamp, MD    Family History Family History  Problem Relation Age of Onset  . Cancer Mother     cervical CA    Social History Social History  Substance Use Topics  . Smoking status: Never Smoker  . Smokeless tobacco: Never Used  . Alcohol use Yes     Allergies   Codeine and Sulfur   Review of Systems Review of Systems  Constitutional: Negative for fever.  Respiratory: Negative for shortness of breath.   Cardiovascular: Negative for chest pain.  Gastrointestinal: Negative for nausea and vomiting.  Neurological: Negative for focal weakness, seizures, syncope and light-headedness.  Psychiatric/Behavioral: Negative for confusion.  All other systems reviewed and are negative.    Physical Exam Triage Vital Signs ED Triage Vitals  Enc Vitals Group     BP      Pulse      Resp      Temp      Temp src      SpO2      Weight      Height      Head Circumference      Peak Flow      Pain Score      Pain Loc      Pain Edu?      Excl. in [place]?    No data found.   Updated Vital Signs BP 129/85 (BP Location: Right Arm)   Pulse 80   Temp 98 F (36.7 C) (Oral)   Wt 145 lb (65.8 kg)   SpO2 100%    BMI 28.32 kg/m   Visual Acuity Right Eye Distance:   Left Eye Distance:   Bilateral Distance:    Right Eye Near:   Left Eye Near:    Bilateral Near:     Physical Exam  Constitutional: She is oriented to person, place, and time. She appears well-developed and well-nourished. No distress.  HENT:  Head: Normocephalic and atraumatic.  Eyes: Pupils are equal, round, and reactive to light. No scleral icterus.  Neck: Normal range of motion. Neck supple.  Cardiovascular: Normal rate and regular rhythm.   Pulmonary/Chest: Effort normal.  Abdominal: She exhibits no distension.  Musculoskeletal:       Left hand: She exhibits normal range of motion, no bony tenderness and normal capillary refill. Normal sensation noted. Normal strength noted.       Hands: Neurological: She is alert and oriented to person, place, and time. No cranial nerve deficit.  Skin: Skin is warm and dry.  Psychiatric: She has a normal mood and affect. Her behavior is normal.  Vitals reviewed.    UC Treatments / Results  Labs (all labs ordered are listed, but only abnormal results are displayed) Labs Reviewed - No data to display  EKG  EKG Interpretation None       Radiology No results found.  Procedures .Marland KitchenLaceration Repair Date/Time: 07/20/2016 2:12 PM Performed by: Burnett Harry DAVID Authorized by: Burnett Harry, DAVID   Consent:    Consent obtained:  Verbal   Consent given by:  Patient   Risks discussed:  Infection, pain, retained foreign body, tendon damage, poor cosmetic result, need for additional repair, nerve damage, poor wound healing and vascular damage   Alternatives discussed:  Referral Universal protocol:    Procedure explained and questions answered to patient or proxy's satisfaction: yes     Immediately prior to procedure, a time out was called: yes     Patient identity confirmed:  Verbally with patient Anesthesia (see MAR for exact dosages):    Anesthesia method:  Nerve block   Block  anesthetic:  Lidocaine 2% w/o epi   Block technique:  Digital block   Block injection procedure:  Anatomic landmarks identified, introduced needle, incremental injection, anatomic landmarks palpated and negative aspiration for blood   Block outcome:  Anesthesia achieved Laceration details:    Location:  Finger   Finger location:  L index finger   Length (cm):  3   Depth (mm):  7 Pre-procedure details:    Preparation:  Patient was prepped and draped in usual sterile fashion Exploration:    Hemostasis achieved with:  Direct pressure   Wound exploration: wound explored through full range of motion and entire depth of wound probed and visualized     Wound extent: fascia violated     Wound extent: no foreign bodies/material noted, no muscle damage noted, no nerve damage noted, no tendon damage noted, no underlying fracture noted and no vascular damage noted     Contaminated: no   Treatment:    Area cleansed with:  Hibiclens   Amount of cleaning:  Extensive   Irrigation solution:  Sterile water   Irrigation method:  Syringe   Foreign body removal: No foreign body seen.   Skin repair:    Repair method:  Sutures   Suture size:  4-0   Suture material:  Nylon   Suture technique:  Simple interrupted   Number of sutures:  8 Approximation:    Approximation:  Close   Vermilion border: well-aligned   Post-procedure details:    Dressing:  Non-adherent dressing, sterile dressing, bulky dressing and splint for protection   Patient tolerance of procedure:  Tolerated well, no immediate complications Comments:     Aftercare and red flags discussed. Written and verbal information given. Suture removal 12-14 days.     (including critical care time)  Medications Ordered in UC Medications  tetanus & diphtheria toxoids (adult) (TENIVAC) injection 0.5 mL (0.5 mLs Intramuscular Given 07/18/16 1615)     Initial Impression / Assessment and Plan / UC Course  I have reviewed the triage vital signs  and the nursing notes.  Pertinent labs & imaging results that were available during my care of the patient were reviewed by me and considered in my medical decision making (see chart for details).      Final Clinical Impressions(s) / UC Diagnoses   Final diagnoses:  Laceration of index finger without complication, initial encounter   Laceration repair, see details above. DT given. She declined any prescription pain med, she prefers to use Tylenol or ibuprofen as needed for pain. At time of discharge from urgent care, she had no pain.  Follow-up 12-14 days for suture removal. Sooner if any problems. An After Visit Summary was printed and given to the patient. "Keep wound clean and dry until Wed am.   Then, change dressing Wed am. (daily thereafter) Suture removal in 12-14 days. Return sooner if any problems."  Precautions discussed. Red flags discussed. Questions invited and answered. Patient voiced understanding and agreement.    Jacqulyn Cane, MD 07/20/16 [phone]

## 2016-07-28 ENCOUNTER — Ambulatory Visit: Payer: BLUE CROSS/BLUE SHIELD | Admitting: Sports Medicine

## 2016-08-04 ENCOUNTER — Ambulatory Visit (INDEPENDENT_AMBULATORY_CARE_PROVIDER_SITE_OTHER): Payer: BLUE CROSS/BLUE SHIELD | Admitting: Sports Medicine

## 2016-08-04 DIAGNOSIS — F4323 Adjustment disorder with mixed anxiety and depressed mood: Secondary | ICD-10-CM | POA: Diagnosis not present

## 2016-08-04 DIAGNOSIS — R41840 Attention and concentration deficit: Secondary | ICD-10-CM

## 2016-08-04 MED ORDER — TRAZODONE HCL 150 MG PO TABS: 150.0000 mg | ORAL_TABLET | Freq: Every day | ORAL | 3 refills | Status: DC

## 2016-08-04 MED ORDER — AMPHETAMINE-DEXTROAMPHET ER 15 MG PO CP24
15.0000 mg | ORAL_CAPSULE | ORAL | 0 refills | Status: DC
Start: 2016-08-04 — End: 2016-12-06

## 2016-08-04 NOTE — Assessment & Plan Note (Signed)
I think that a great deal of this has to do with her concurrent depression and anxiety, as well as the current situation at home and the separation. She does endorse attention deficit symptoms from childhood, I'm adding a low dose Adderall XR, and I would like to start the evaluation process for ADHD with psychology.

## 2016-08-04 NOTE — Assessment & Plan Note (Signed)
Continues to improve, we continued with Wellbutrin 300 and increase trazodone to 100 at the last visit, she is it's a little better but still wakes up at about 4 AM, this is consistent with depression. Increasing trazodone to 150 mg. Return in one month for a PHQ9 and GAD7.

## 2016-08-04 NOTE — Progress Notes (Signed)
  Subjective:    CC: Follow-up  HPI: Anxiety and depression: Improved with the increase of trazodone to 100 mg, sleeping a bit better but still wakes up at 4 AM. No suicidal or homicidal ideation.  Difficulty concentrating: Noted throughout her life, has never been formally evaluated for attention deficit disorder.  Past medical history:  Negative.  See flowsheet/record as well for more information.  Surgical history: Negative.  See flowsheet/record as well for more information.  Family history: Negative.  See flowsheet/record as well for more information.  Social history: Negative.  See flowsheet/record as well for more information.  Allergies, and medications have been entered into the medical record, reviewed, and no changes needed.   Review of Systems: No fevers, chills, night sweats, weight loss, chest pain, or shortness of breath.   Objective:    General: Well Developed, well nourished, and in no acute distress.  Neuro: Alert and oriented x3, extra-ocular muscles intact, sensation grossly intact.  HEENT: Normocephalic, atraumatic, pupils equal round reactive to light, neck supple, no masses, no lymphadenopathy, thyroid nonpalpable.  Skin: Warm and dry, no rashes. Cardiac: Regular rate and rhythm, no murmurs rubs or gallops, no lower extremity edema.  Respiratory: Clear to auscultation bilaterally. Not using accessory muscles, speaking in full sentences.  Impression and Recommendations:    Adjustment disorder with mixed anxiety and depressed mood Continues to improve, we continued with Wellbutrin 300 and increase trazodone to 100 at the last visit, she is it's a little better but still wakes up at about 4 AM, this is consistent with depression. Increasing trazodone to 150 mg. Return in one month for a PHQ9 and GAD7.  Difficulty concentrating I think that a great deal of this has to do with her concurrent depression and anxiety, as well as the current situation at home and the  separation. She does endorse attention deficit symptoms from childhood, I'm adding a low dose Adderall XR, and I would like to start the evaluation process for ADHD with psychology.  I spent 25 minutes with this patient, greater than 50% was face-to-face time counseling regarding the above diagnoses

## 2016-08-17 ENCOUNTER — Other Ambulatory Visit: Payer: Self-pay | Admitting: Sports Medicine

## 2016-08-17 DIAGNOSIS — F4323 Adjustment disorder with mixed anxiety and depressed mood: Secondary | ICD-10-CM

## 2016-08-25 ENCOUNTER — Other Ambulatory Visit: Payer: Self-pay | Admitting: Sports Medicine

## 2016-08-25 DIAGNOSIS — I10 Essential (primary) hypertension: Secondary | ICD-10-CM

## 2016-08-25 DIAGNOSIS — F4323 Adjustment disorder with mixed anxiety and depressed mood: Secondary | ICD-10-CM

## 2016-09-05 ENCOUNTER — Encounter: Payer: Self-pay | Admitting: Sports Medicine

## 2016-09-05 ENCOUNTER — Ambulatory Visit (INDEPENDENT_AMBULATORY_CARE_PROVIDER_SITE_OTHER): Payer: BLUE CROSS/BLUE SHIELD | Admitting: Sports Medicine

## 2016-09-05 DIAGNOSIS — F4323 Adjustment disorder with mixed anxiety and depressed mood: Secondary | ICD-10-CM | POA: Diagnosis not present

## 2016-09-05 MED ORDER — TRAZODONE HCL 150 MG PO TABS: 150.0000 mg | ORAL_TABLET | Freq: Every day | ORAL | 3 refills | Status: DC

## 2016-09-05 MED ORDER — BUPROPION HCL ER (XL) 300 MG PO TB24: 300.0000 mg | ORAL_TABLET | Freq: Every day | ORAL | 3 refills | Status: DC

## 2016-09-05 NOTE — Assessment & Plan Note (Signed)
Sleeping well on 150 of trazodone, mood is well controlled per patient on 300 and Wellbutrin in spite of PHQ9 and GAD7 score. Symptoms have stabilized, she feels relatively happy in spite of everything. She has not yet decided whether or not she wants to continue the marriage.

## 2016-09-05 NOTE — Progress Notes (Signed)
  Subjective:    CC: Follow-up  HPI: This is a pleasant 36 year old female with an adjustment disorder, she has improved considerably with Wellbutrin 300, more recently we increased her trazodone to 150 mg, she sleeping well now. Happy with how things are going, has not yet decided what she would like to do with her marriage, no suicidal or homicidal ideation.  Past medical history:  Negative.  See flowsheet/record as well for more information.  Surgical history: Negative.  See flowsheet/record as well for more information.  Family history: Negative.  See flowsheet/record as well for more information.  Social history: Negative.  See flowsheet/record as well for more information.  Allergies, and medications have been entered into the medical record, reviewed, and no changes needed.   Review of Systems: No fevers, chills, night sweats, weight loss, chest pain, or shortness of breath.   Objective:    General: Well Developed, well nourished, and in no acute distress.  Neuro: Alert and oriented x3, extra-ocular muscles intact, sensation grossly intact.  HEENT: Normocephalic, atraumatic, pupils equal round reactive to light, neck supple, no masses, no lymphadenopathy, thyroid nonpalpable.  Skin: Warm and dry, no rashes. Cardiac: Regular rate and rhythm, no murmurs rubs or gallops, no lower extremity edema.  Respiratory: Clear to auscultation bilaterally. Not using accessory muscles, speaking in full sentences.  Impression and Recommendations:    Adjustment disorder with mixed anxiety and depressed mood Sleeping well on 150 of trazodone, mood is well controlled per patient on 300 and Wellbutrin in spite of PHQ9 and GAD7 score. Symptoms have stabilized, she feels relatively happy in spite of everything. She has not yet decided whether or not she wants to continue the marriage.

## 2016-09-30 ENCOUNTER — Ambulatory Visit: Payer: Self-pay | Admitting: Sports Medicine

## 2016-10-24 ENCOUNTER — Other Ambulatory Visit: Payer: Self-pay | Admitting: Sports Medicine

## 2016-10-24 DIAGNOSIS — R635 Abnormal weight gain: Secondary | ICD-10-CM

## 2016-10-25 ENCOUNTER — Other Ambulatory Visit: Payer: Self-pay | Admitting: Sports Medicine

## 2016-10-25 DIAGNOSIS — F4323 Adjustment disorder with mixed anxiety and depressed mood: Secondary | ICD-10-CM

## 2016-12-06 ENCOUNTER — Encounter: Payer: Self-pay | Admitting: Sports Medicine

## 2016-12-06 ENCOUNTER — Ambulatory Visit (INDEPENDENT_AMBULATORY_CARE_PROVIDER_SITE_OTHER): Payer: BLUE CROSS/BLUE SHIELD | Admitting: Sports Medicine

## 2016-12-06 DIAGNOSIS — Z0000 Encounter for general adult medical examination without abnormal findings: Secondary | ICD-10-CM

## 2016-12-06 DIAGNOSIS — Z23 Encounter for immunization: Secondary | ICD-10-CM

## 2016-12-06 DIAGNOSIS — R635 Abnormal weight gain: Secondary | ICD-10-CM

## 2016-12-06 DIAGNOSIS — F4323 Adjustment disorder with mixed anxiety and depressed mood: Secondary | ICD-10-CM

## 2016-12-06 MED ORDER — PHENTERMINE HCL 37.5 MG PO TABS: ORAL_TABLET | ORAL | 0 refills | Status: DC

## 2016-12-06 NOTE — Assessment & Plan Note (Signed)
Now that she is off of Adderall I'm restarting phentermine. Target weight is 115 pounds. Return to see me monthly.

## 2016-12-06 NOTE — Progress Notes (Signed)
-   Subjective:    CC: Follow-up  HPI: Adjustment disorder: Symptoms now well controlled on Wellbutrin 300, trazodone at bedtime, no longer needs her alprazolam.  Abnormal weight gain: Having difficulty weight, she is now separated, would like to get back in shape.  Preventive measures: Due for flu shot.  Past medical history:  Negative.  See flowsheet/record as well for more information.  Surgical history: Negative.  See flowsheet/record as well for more information.  Family history: Negative.  See flowsheet/record as well for more information.  Social history: Negative.  See flowsheet/record as well for more information.  Allergies, and medications have been entered into the medical record, reviewed, and no changes needed.   Review of Systems: No fevers, chills, night sweats, weight loss, chest pain, or shortness of breath.   Objective:    General: Well Developed, well nourished, and in no acute distress.  Neuro: Alert and oriented x3, extra-ocular muscles intact, sensation grossly intact.  HEENT: Normocephalic, atraumatic, pupils equal round reactive to light, neck supple, no masses, no lymphadenopathy, thyroid nonpalpable.  Skin: Warm and dry, no rashes. Cardiac: Regular rate and rhythm, no murmurs rubs or gallops, no lower extremity edema.  Respiratory: Clear to auscultation bilaterally. Not using accessory muscles, speaking in full sentences.  Impression and Recommendations:    Adjustment disorder with mixed anxiety and depressed mood Continues to do well on current regimen. Separated. Return as needed for this, no changes. She has not needed any alprazolam, we will take this off the list, I'm happy to give her a bit more should she absolutely need it.  Abnormal weight gain Now that she is off of Adderall I'm restarting phentermine. Target weight is 115 pounds. Return to see me monthly.  Annual physical exam Flu vaccine today will get her completely up-to-date on  preventative measures.  I spent 25 minutes with this patient, greater than 50% was face-to-face time counseling regarding the above diagnoses ___________________________________________ Gwen Her. Dianah Field, M.D., ABFM., CAQSM. Primary Care and [place] Instructor of [place] of [facility] of Medicine

## 2016-12-06 NOTE — Assessment & Plan Note (Signed)
Continues to do well on current regimen. Separated. Return as needed for this, no changes. She has not needed any alprazolam, we will take this off the list, I'm happy to give her a bit more should she absolutely need it.

## 2016-12-06 NOTE — Assessment & Plan Note (Signed)
Flu vaccine today will get her completely up-to-date on preventative measures.

## 2017-01-03 ENCOUNTER — Ambulatory Visit (INDEPENDENT_AMBULATORY_CARE_PROVIDER_SITE_OTHER): Payer: BLUE CROSS/BLUE SHIELD | Admitting: Sports Medicine

## 2017-01-03 DIAGNOSIS — R635 Abnormal weight gain: Secondary | ICD-10-CM | POA: Diagnosis not present

## 2017-01-03 MED ORDER — PHENTERMINE HCL 37.5 MG PO TABS: ORAL_TABLET | ORAL | 0 refills | Status: DC

## 2017-01-03 NOTE — Progress Notes (Signed)
  Subjective:    CC: Weight check  HPI: Good weight loss after the first month on phentermine, no adverse effects.  Past medical history:  Negative.  See flowsheet/record as well for more information.  Surgical history: Negative.  See flowsheet/record as well for more information.  Family history: Negative.  See flowsheet/record as well for more information.  Social history: Negative.  See flowsheet/record as well for more information.  Allergies, and medications have been entered into the medical record, reviewed, and no changes needed.   Review of Systems: No fevers, chills, night sweats, weight loss, chest pain, or shortness of breath.   Objective:    General: Well Developed, well nourished, and in no acute distress.  Neuro: Alert and oriented x3, extra-ocular muscles intact, sensation grossly intact.  HEENT: Normocephalic, atraumatic, pupils equal round reactive to light, neck supple, no masses, no lymphadenopathy, thyroid nonpalpable.  Skin: Warm and dry, no rashes. Cardiac: Regular rate and rhythm, no murmurs rubs or gallops, no lower extremity edema.  Respiratory: Clear to auscultation bilaterally. Not using accessory muscles, speaking in full sentences.  Impression and Recommendations:    Abnormal weight gain Good weight loss after the first month, refilling medication.  ___________________________________________ Gwen Her. Dianah Field, M.D., ABFM., CAQSM. Primary Care and [place] Instructor of [place] of [facility] of Medicine

## 2017-01-03 NOTE — Assessment & Plan Note (Signed)
Good weight loss after the first month, refilling medication.

## 2017-01-20 ENCOUNTER — Other Ambulatory Visit: Payer: Self-pay | Admitting: Sports Medicine

## 2017-01-20 DIAGNOSIS — R635 Abnormal weight gain: Secondary | ICD-10-CM

## 2017-01-31 ENCOUNTER — Ambulatory Visit: Payer: Self-pay | Admitting: Sports Medicine

## 2017-02-06 ENCOUNTER — Ambulatory Visit: Payer: Self-pay | Admitting: Sports Medicine

## 2017-02-06 DIAGNOSIS — Z0189 Encounter for other specified special examinations: Secondary | ICD-10-CM

## 2017-02-14 ENCOUNTER — Other Ambulatory Visit: Payer: Self-pay

## 2017-02-14 DIAGNOSIS — I10 Essential (primary) hypertension: Secondary | ICD-10-CM

## 2017-02-14 DIAGNOSIS — M542 Cervicalgia: Secondary | ICD-10-CM

## 2017-02-14 DIAGNOSIS — E282 Polycystic ovarian syndrome: Secondary | ICD-10-CM

## 2017-02-14 DIAGNOSIS — F4323 Adjustment disorder with mixed anxiety and depressed mood: Secondary | ICD-10-CM

## 2017-02-14 MED ORDER — METFORMIN HCL ER 500 MG PO TB24
500.0000 mg | ORAL_TABLET | Freq: Every day | ORAL | 1 refills | Status: DC
Start: 2017-02-14 — End: 2017-11-22

## 2017-02-14 MED ORDER — BUPROPION HCL ER (XL) 300 MG PO TB24: 300.0000 mg | ORAL_TABLET | Freq: Every day | ORAL | 1 refills | Status: DC

## 2017-02-14 MED ORDER — LISINOPRIL 10 MG PO TABS: 10.0000 mg | ORAL_TABLET | Freq: Every day | ORAL | 1 refills | Status: DC

## 2017-02-14 MED ORDER — MELOXICAM 15 MG PO TABS: ORAL_TABLET | ORAL | 1 refills | Status: DC

## 2017-11-22 ENCOUNTER — Other Ambulatory Visit: Payer: Self-pay | Admitting: Sports Medicine

## 2017-11-22 DIAGNOSIS — F4323 Adjustment disorder with mixed anxiety and depressed mood: Secondary | ICD-10-CM

## 2017-11-22 DIAGNOSIS — M542 Cervicalgia: Secondary | ICD-10-CM

## 2017-11-22 DIAGNOSIS — E282 Polycystic ovarian syndrome: Secondary | ICD-10-CM

## 2017-11-23 ENCOUNTER — Other Ambulatory Visit: Payer: Self-pay | Admitting: Sports Medicine

## 2017-11-23 DIAGNOSIS — I10 Essential (primary) hypertension: Secondary | ICD-10-CM

## 2018-01-02 ENCOUNTER — Ambulatory Visit: Payer: 59 | Admitting: Sports Medicine

## 2018-01-02 ENCOUNTER — Encounter: Payer: Self-pay | Admitting: Sports Medicine

## 2018-01-02 VITALS — BP 145/92 | HR 79 | Ht 60.0 in | Wt 161.0 lb

## 2018-01-02 DIAGNOSIS — E282 Polycystic ovarian syndrome: Secondary | ICD-10-CM

## 2018-01-02 DIAGNOSIS — Z23 Encounter for immunization: Secondary | ICD-10-CM

## 2018-01-02 DIAGNOSIS — F4323 Adjustment disorder with mixed anxiety and depressed mood: Secondary | ICD-10-CM

## 2018-01-02 DIAGNOSIS — I10 Essential (primary) hypertension: Secondary | ICD-10-CM

## 2018-01-02 DIAGNOSIS — R635 Abnormal weight gain: Secondary | ICD-10-CM

## 2018-01-02 DIAGNOSIS — Z0000 Encounter for general adult medical examination without abnormal findings: Secondary | ICD-10-CM

## 2018-01-02 MED ORDER — PHENTERMINE HCL 37.5 MG PO TABS: ORAL_TABLET | ORAL | 0 refills | Status: DC

## 2018-01-02 MED ORDER — TRAZODONE HCL 150 MG PO TABS: 150.0000 mg | ORAL_TABLET | Freq: Every day | ORAL | 3 refills | Status: DC

## 2018-01-02 MED ORDER — METFORMIN HCL ER 500 MG PO TB24: ORAL_TABLET | ORAL | 1 refills | Status: DC

## 2018-01-02 MED ORDER — LISINOPRIL 10 MG PO TABS: 10.0000 mg | ORAL_TABLET | Freq: Every day | ORAL | 1 refills | Status: DC

## 2018-01-02 MED ORDER — BUPROPION HCL ER (XL) 300 MG PO TB24: 300.0000 mg | ORAL_TABLET | Freq: Every day | ORAL | 1 refills | Status: DC

## 2018-01-02 MED ORDER — TOPIRAMATE 50 MG PO TABS: ORAL_TABLET | ORAL | 0 refills | Status: DC

## 2018-01-02 NOTE — Progress Notes (Signed)
Subjective:    CC: Get back on treatments  HPI: This is a pleasant 37 year old female, she returns, she has not been here in a year, desires to get back on all medications, she has stopped everything.  Anxiety and depression: Would like to get back on trazodone, Wellbutrin, no suicidal or homicidal ideation.  Obesity: Would like to restart phentermine.  PCOS: Would like to restart metformin.  Benign essential hypertension: Needs to restart lisinopril, no headache, visual changes, chest pain.  I reviewed the past medical history, family history, social history, surgical history, and allergies today and no changes were needed.  Please see the problem list section below in epic for further details.  Past Medical History: No past medical history on file. Past Surgical History: Past Surgical History:  Procedure Laterality Date  . TUBAL LIGATION     Social History: Social History   Socioeconomic History  . Marital status: Married    Spouse name: Not on file  . Number of children: Not on file  . Years of education: Not on file  . Highest education level: Not on file  Occupational History  . Not on file  Social Needs  . Financial resource strain: Not on file  . Food insecurity:    Worry: Not on file    Inability: Not on file  . Transportation needs:    Medical: Not on file    Non-medical: Not on file  Tobacco Use  . Smoking status: Never Smoker  . Smokeless tobacco: Never Used  Substance and Sexual Activity  . Alcohol use: Yes  . Drug use: No  . Sexual activity: Not on file  Lifestyle  . Physical activity:    Days per week: Not on file    Minutes per session: Not on file  . Stress: Not on file  Relationships  . Social connections:    Talks on phone: Not on file    Gets together: Not on file    Attends religious service: Not on file    Active member of club or organization: Not on file    Attends meetings of clubs or organizations: Not on file    Relationship  status: Not on file  Other Topics Concern  . Not on file  Social History Narrative  . Not on file   Family History: Family History  Problem Relation Age of Onset  . Cancer Mother        cervical CA   Allergies: Allergies  Allergen Reactions  . Codeine   . Sulfur    Medications: See med rec.  Review of Systems: No fevers, chills, night sweats, weight loss, chest pain, or shortness of breath.   Objective:    General: Well Developed, well nourished, and in no acute distress.  Neuro: Alert and oriented x3, extra-ocular muscles intact, sensation grossly intact.  HEENT: Normocephalic, atraumatic, pupils equal round reactive to light, neck supple, no masses, no lymphadenopathy, thyroid nonpalpable.  Skin: Warm and dry, no rashes. Cardiac: Regular rate and rhythm, no murmurs rubs or gallops, no lower extremity edema.  Respiratory: Clear to auscultation bilaterally. Not using accessory muscles, speaking in full sentences.  Impression and Recommendations:    Adjustment disorder with mixed anxiety and depressed mood Uncontrolled, off of all medications, she does desire to restart everything. No suicidal or homicidal ideation.  Abnormal weight gain Restarting phentermine and Topamax, return monthly for weight checks and refills.  Annual physical exam Flu shot today.  Essential hypertension, benign Elevated, she is going  to restart her lisinopril. ___________________________________________ Gwen Her. Dianah Field, M.D., ABFM., CAQSM. Primary Care and [place] Instructor of [place] of [facility] of Medicine

## 2018-01-02 NOTE — Assessment & Plan Note (Signed)
Flu shot today 

## 2018-01-02 NOTE — Assessment & Plan Note (Signed)
Elevated, she is going to restart her lisinopril.

## 2018-01-02 NOTE — Assessment & Plan Note (Signed)
Restarting phentermine and Topamax, return monthly for weight checks and refills.

## 2018-01-02 NOTE — Assessment & Plan Note (Signed)
Uncontrolled, off of all medications, she does desire to restart everything. No suicidal or homicidal ideation.

## 2018-01-30 ENCOUNTER — Ambulatory Visit: Payer: 59 | Admitting: Sports Medicine

## 2018-01-30 ENCOUNTER — Encounter: Payer: Self-pay | Admitting: Sports Medicine

## 2018-01-30 DIAGNOSIS — R635 Abnormal weight gain: Secondary | ICD-10-CM

## 2018-01-30 DIAGNOSIS — F4323 Adjustment disorder with mixed anxiety and depressed mood: Secondary | ICD-10-CM | POA: Diagnosis not present

## 2018-01-30 DIAGNOSIS — I10 Essential (primary) hypertension: Secondary | ICD-10-CM

## 2018-01-30 MED ORDER — PHENTERMINE HCL 37.5 MG PO TABS: ORAL_TABLET | ORAL | 0 refills | Status: DC

## 2018-01-30 MED ORDER — TOPIRAMATE 50 MG PO TABS: 50.0000 mg | ORAL_TABLET | Freq: Two times a day (BID) | ORAL | 0 refills | Status: DC

## 2018-01-30 MED ORDER — LISINOPRIL 5 MG PO TABS: 5.0000 mg | ORAL_TABLET | Freq: Every day | ORAL | 3 refills | Status: DC

## 2018-01-30 NOTE — Assessment & Plan Note (Signed)
Improved with restarting Wellbutrin and trazodone. We will not change the doses for now.

## 2018-01-30 NOTE — Assessment & Plan Note (Signed)
Blood pressure is a bit soft, decreasing down to 5 mg of lisinopril.

## 2018-01-30 NOTE — Progress Notes (Signed)
  Subjective:    CC: Weight check  HPI: Obesity: Good weight loss after the first month on phentermine.  Mood disorder: Improved with restarting trazodone and Wellbutrin.  No suicidal or homicidal ideation.  Hypertension: Much improved with the addition of lisinopril.  No orthostatic symptoms.  I reviewed the past medical history, family history, social history, surgical history, and allergies today and no changes were needed.  Please see the problem list section below in epic for further details.  Past Medical History: No past medical history on file. Past Surgical History: Past Surgical History:  Procedure Laterality Date  . TUBAL LIGATION     Social History: Social History   Socioeconomic History  . Marital status: Married    Spouse name: Not on file  . Number of children: Not on file  . Years of education: Not on file  . Highest education level: Not on file  Occupational History  . Not on file  Social Needs  . Financial resource strain: Not on file  . Food insecurity:    Worry: Not on file    Inability: Not on file  . Transportation needs:    Medical: Not on file    Non-medical: Not on file  Tobacco Use  . Smoking status: Never Smoker  . Smokeless tobacco: Never Used  Substance and Sexual Activity  . Alcohol use: Yes  . Drug use: No  . Sexual activity: Not on file  Lifestyle  . Physical activity:    Days per week: Not on file    Minutes per session: Not on file  . Stress: Not on file  Relationships  . Social connections:    Talks on phone: Not on file    Gets together: Not on file    Attends religious service: Not on file    Active member of club or organization: Not on file    Attends meetings of clubs or organizations: Not on file    Relationship status: Not on file  Other Topics Concern  . Not on file  Social History Narrative  . Not on file   Family History: Family History  Problem Relation Age of Onset  . Cancer Mother        cervical CA    Allergies: Allergies  Allergen Reactions  . Codeine   . Sulfur    Medications: See med rec.  Review of Systems: No fevers, chills, night sweats, weight loss, chest pain, or shortness of breath.   Objective:    General: Well Developed, well nourished, and in no acute distress.  Neuro: Alert and oriented x3, extra-ocular muscles intact, sensation grossly intact.  HEENT: Normocephalic, atraumatic, pupils equal round reactive to light, neck supple, no masses, no lymphadenopathy, thyroid nonpalpable.  Skin: Warm and dry, no rashes. Cardiac: Regular rate and rhythm, no murmurs rubs or gallops, no lower extremity edema.  Respiratory: Clear to auscultation bilaterally. Not using accessory muscles, speaking in full sentences.  Impression and Recommendations:    Adjustment disorder with mixed anxiety and depressed mood Improved with restarting Wellbutrin and trazodone. We will not change the doses for now.  Abnormal weight gain 4 pound weight loss after the first month restarting phentermine and topiramate, refilling.  Essential hypertension, benign Blood pressure is a bit soft, decreasing down to 5 mg of lisinopril. ___________________________________________ Gwen Her. Dianah Field, M.D., ABFM., CAQSM. Primary Care and Sports Medicine [place] [facility]  Adjunct Professor of [place] [employer] [facility] [employer]

## 2018-01-30 NOTE — Assessment & Plan Note (Signed)
4 pound weight loss after the first month restarting phentermine and topiramate, refilling.

## 2018-02-20 ENCOUNTER — Other Ambulatory Visit: Payer: Self-pay | Admitting: Sports Medicine

## 2018-02-20 DIAGNOSIS — R635 Abnormal weight gain: Secondary | ICD-10-CM

## 2018-03-01 ENCOUNTER — Ambulatory Visit: Payer: Self-pay | Admitting: Sports Medicine

## 2018-04-01 ENCOUNTER — Other Ambulatory Visit: Payer: Self-pay | Admitting: Sports Medicine

## 2018-04-01 DIAGNOSIS — R635 Abnormal weight gain: Secondary | ICD-10-CM

## 2018-05-17 ENCOUNTER — Other Ambulatory Visit: Payer: Self-pay | Admitting: Sports Medicine

## 2018-05-17 DIAGNOSIS — R635 Abnormal weight gain: Secondary | ICD-10-CM

## 2018-05-22 ENCOUNTER — Ambulatory Visit: Payer: 59 | Admitting: Sports Medicine

## 2018-05-22 ENCOUNTER — Encounter: Payer: Self-pay | Admitting: Sports Medicine

## 2018-05-22 DIAGNOSIS — F4323 Adjustment disorder with mixed anxiety and depressed mood: Secondary | ICD-10-CM

## 2018-05-22 DIAGNOSIS — R635 Abnormal weight gain: Secondary | ICD-10-CM

## 2018-05-22 MED ORDER — VORTIOXETINE HBR 10 MG PO TABS: 10.0000 mg | ORAL_TABLET | Freq: Every day | ORAL | 3 refills | Status: DC

## 2018-05-22 MED ORDER — BUPROPION HCL ER (XL) 150 MG PO TB24: 150.0000 mg | ORAL_TABLET | Freq: Every day | ORAL | 3 refills | Status: DC

## 2018-05-22 MED ORDER — PHENTERMINE HCL 37.5 MG PO TABS: ORAL_TABLET | ORAL | 0 refills | Status: DC

## 2018-05-22 MED ORDER — TOPIRAMATE 50 MG PO TABS: 50.0000 mg | ORAL_TABLET | Freq: Two times a day (BID) | ORAL | 0 refills | Status: DC

## 2018-05-22 NOTE — Progress Notes (Signed)
Subjective:    CC: Follow-up  HPI: Abnormal weight gain: Patient returns, she missed a couple of months but still managed to lose weight.  Anxiety depression: With historically controlled on Wellbutrin 300 and trazodone 50, has been having increasing irritability and anxiety, agreeable to switch up the medications and do behavioral therapy, no suicidal or homicidal ideation.  I reviewed the past medical history, family history, social history, surgical history, and allergies today and no changes were needed.  Please see the problem list section below in epic for further details.  Past Medical History: No past medical history on file. Past Surgical History: Past Surgical History:  Procedure Laterality Date  . TUBAL LIGATION     Social History: Social History   Socioeconomic History  . Marital status: Married    Spouse name: Not on file  . Number of children: Not on file  . Years of education: Not on file  . Highest education level: Not on file  Occupational History  . Not on file  Social Needs  . Financial resource strain: Not on file  . Food insecurity:    Worry: Not on file    Inability: Not on file  . Transportation needs:    Medical: Not on file    Non-medical: Not on file  Tobacco Use  . Smoking status: Never Smoker  . Smokeless tobacco: Never Used  Substance and Sexual Activity  . Alcohol use: Yes  . Drug use: No  . Sexual activity: Not on file  Lifestyle  . Physical activity:    Days per week: Not on file    Minutes per session: Not on file  . Stress: Not on file  Relationships  . Social connections:    Talks on phone: Not on file    Gets together: Not on file    Attends religious service: Not on file    Active member of club or organization: Not on file    Attends meetings of clubs or organizations: Not on file    Relationship status: Not on file  Other Topics Concern  . Not on file  Social History Narrative  . Not on file   Family  History: Family History  Problem Relation Age of Onset  . Cancer Mother        cervical CA   Allergies: Allergies  Allergen Reactions  . Codeine   . Sulfur    Medications: See med rec.  Review of Systems: No fevers, chills, night sweats, weight loss, chest pain, or shortness of breath.   Objective:    General: Well Developed, well nourished, and in no acute distress.  Neuro: Alert and oriented x3, extra-ocular muscles intact, sensation grossly intact.  HEENT: Normocephalic, atraumatic, pupils equal round reactive to light, neck supple, no masses, no lymphadenopathy, thyroid nonpalpable.  Skin: Warm and dry, no rashes. Cardiac: Regular rate and rhythm, no murmurs rubs or gallops, no lower extremity edema.  Respiratory: Clear to auscultation bilaterally. Not using accessory muscles, speaking in full sentences.  Impression and Recommendations:    Adjustment disorder with mixed anxiety and depressed mood Still feeling somewhat anxious, she is somewhat more irritable, decreasing Wellbutrin down to 150. Continue trazodone. Starting Trintellix, she has had inadequate responses and intolerances to Lexapro, Zoloft. Also adding behavioral therapy here. Return to see me in 1 month.   Abnormal weight gain We are now 3 months past her last recheck. She still managed to lose weight even though she was out of phentermine and Topamax. Restarting  medication, entering the second month, return in 1 month.   ___________________________________________ Gwen Her. Dianah Field, M.D., ABFM., CAQSM. Primary Care and Sports Medicine [place] [facility]  Adjunct Professor of [place] [employer] [facility] [employer]

## 2018-05-22 NOTE — Assessment & Plan Note (Signed)
We are now 3 months past her last recheck. She still managed to lose weight even though she was out of phentermine and Topamax. Restarting medication, entering the second month, return in 1 month.

## 2018-05-22 NOTE — Assessment & Plan Note (Signed)
Still feeling somewhat anxious, she is somewhat more irritable, decreasing Wellbutrin down to 150. Continue trazodone. Starting Trintellix, she has had inadequate responses and intolerances to Lexapro, Zoloft. Also adding behavioral therapy here. Return to see me in 1 month.

## 2018-06-05 ENCOUNTER — Telehealth: Payer: Self-pay | Admitting: Sports Medicine

## 2018-06-05 NOTE — Telephone Encounter (Signed)
Received fax from [employer] that Trintellix was approved from 06/05/2018 through 06/05/2019. Pharmacy notified and forms sent to scan.

## 2018-06-05 NOTE — Telephone Encounter (Signed)
Received fax from [employer] that Trintellix requires a PA. Information has been sent to the insurance company. Awaiting determination.   

## 2018-06-20 ENCOUNTER — Ambulatory Visit: Payer: 59 | Admitting: Sports Medicine

## 2018-06-25 ENCOUNTER — Other Ambulatory Visit: Payer: Self-pay | Admitting: Sports Medicine

## 2018-06-25 DIAGNOSIS — R635 Abnormal weight gain: Secondary | ICD-10-CM

## 2018-07-11 ENCOUNTER — Emergency Department
Admission: EM | Admit: 2018-07-11 | Discharge: 2018-07-11 | Disposition: A | Discharge status: Home / Self Care | Payer: 59 | Source: Home / Self Care | Attending: Family Medicine | Admitting: Family Medicine

## 2018-07-11 ENCOUNTER — Emergency Department (INDEPENDENT_AMBULATORY_CARE_PROVIDER_SITE_OTHER): Payer: 59

## 2018-07-11 ENCOUNTER — Telehealth: Payer: Self-pay | Admitting: Emergency Medicine

## 2018-07-11 ENCOUNTER — Other Ambulatory Visit: Payer: Self-pay

## 2018-07-11 DIAGNOSIS — M25571 Pain in right ankle and joints of right foot: Secondary | ICD-10-CM

## 2018-07-11 DIAGNOSIS — M7989 Other specified soft tissue disorders: Secondary | ICD-10-CM

## 2018-07-11 DIAGNOSIS — S93401A Sprain of unspecified ligament of right ankle, initial encounter: Secondary | ICD-10-CM | POA: Diagnosis not present

## 2018-07-11 DIAGNOSIS — S82839A Other fracture of upper and lower end of unspecified fibula, initial encounter for closed fracture: Secondary | ICD-10-CM

## 2018-07-11 NOTE — ED Triage Notes (Signed)
Pt was in her yard last night and stepped in a hole.  Felt a pop.

## 2018-07-11 NOTE — ED Provider Notes (Signed)
[name]    CSN: [id number] Arrival date & time: 07/11/18  1650     History   Chief Complaint Chief Complaint  Patient presents with  . Ankle Injury    HPI Rose Brooks is a 38 y.o. female.   Patient stepped into a hole in her yard last night, and felt a painful popping sensation in her right ankle.  The history is provided by the patient.  Ankle Pain  Location:  Ankle Time since incident:  1 day Injury: yes   Mechanism of injury comment:  Twisted right ankle Ankle location:  R ankle Pain details:    Quality:  Shooting and aching   Radiates to:  Does not radiate   Severity:  Moderate   Onset quality:  Sudden   Duration:  1 day   Timing:  Constant   Progression:  Unchanged Chronicity:  New Prior injury to area:  No Relieved by:  Nothing Worsened by:  Bearing weight Ineffective treatments:  Ice Associated symptoms: decreased ROM, stiffness and swelling   Associated symptoms: no muscle weakness, no numbness and no tingling     History reviewed. No pertinent past medical history.  Patient Active Problem List   Diagnosis Date Noted  . Difficulty concentrating 08/04/2016  . Adjustment disorder with mixed anxiety and depressed mood 04/01/2016  . Perennial allergic rhinitis 03/15/2016  . Neck pain 03/15/2016  . Essential hypertension, benign 12/18/2015  . Annual physical exam 10/23/2015  . PCOS (polycystic ovarian syndrome) 10/23/2015  . Abnormal weight gain 10/23/2015    Past Surgical History:  Procedure Laterality Date  . TUBAL LIGATION      OB History   No obstetric history on file.      Home Medications    Prior to Admission medications   Medication Sig Start Date End Date Taking? Authorizing Provider  buPROPion (WELLBUTRIN XL) 150 MG 24 hr tablet Take 1 tablet (150 mg total) by mouth daily. 05/22/18   Silverio Decamp, MD  lisinopril (PRINIVIL,ZESTRIL) 5 MG tablet Take 1 tablet (5 mg total) by mouth daily. 01/30/18    Silverio Decamp, MD  metFORMIN (GLUCOPHAGE-XR) 500 MG 24 hr tablet TAKE 1 TABLET BY MOUTH EVERY DAY WITH BREAKFAST 01/02/18   Silverio Decamp, MD  phentermine (ADIPEX-P) 37.5 MG tablet One tab by mouth qAM 05/22/18   Silverio Decamp, MD  topiramate (TOPAMAX) 50 MG tablet Take 1 tablet (50 mg total) by mouth 2 (two) times daily. 05/22/18   Silverio Decamp, MD  traZODone (DESYREL) 150 MG tablet Take 1 tablet (150 mg total) by mouth at bedtime. 01/02/18   Silverio Decamp, MD  vortioxetine HBr (TRINTELLIX) 10 MG TABS tablet Take 1 tablet (10 mg total) by mouth daily. Did not respond to Zoloft, Lexapro 05/22/18   Silverio Decamp, MD    Family History Family History  Problem Relation Age of Onset  . Cancer Mother        cervical CA    Social History Social History   Tobacco Use  . Smoking status: Never Smoker  . Smokeless tobacco: Never Used  Substance Use Topics  . Alcohol use: Yes  . Drug use: No     Allergies   Codeine and Sulfur   Review of Systems Review of Systems  Musculoskeletal: Positive for stiffness.  All other systems reviewed and are negative.    Physical Exam Triage Vital Signs ED Triage Vitals  Enc Vitals Group     BP  07/11/18 1741 (!) 159/98     Pulse Rate 07/11/18 1741 85     Resp --      Temp 07/11/18 1741 97.7 F (36.5 C)     Temp src --      SpO2 07/11/18 1741 99 %     Weight 07/11/18 1742 150 lb (68 kg)     Height 07/11/18 1742 5' (1.524 m)     Head Circumference --      Peak Flow --      Pain Score 07/11/18 1741 8     Pain Loc --      Pain Edu? --      Excl. in [place]? --    No data found.  Updated Vital Signs BP (!) 159/98 (BP Location: Right Arm)   Pulse 85   Temp 97.7 F (36.5 C)   Ht 5' (1.524 m)   Wt 68 kg   LMP 07/11/2018   SpO2 99%   BMI 29.29 kg/m   Visual Acuity Right Eye Distance:   Left Eye Distance:   Bilateral Distance:    Right Eye Near:   Left Eye Near:    Bilateral Near:      Physical Exam Vitals signs and nursing note reviewed.  Constitutional:      General: She is not in acute distress. HENT:     Head: Atraumatic.  Eyes:     Pupils: Pupils are equal, round, and reactive to light.  Neck:     Musculoskeletal: Normal range of motion.  Cardiovascular:     Rate and Rhythm: Normal rate.  Pulmonary:     Effort: Pulmonary effort is normal.  Musculoskeletal:     Right ankle: She exhibits decreased range of motion and swelling. She exhibits no ecchymosis, no deformity, no laceration and normal pulse. Tenderness. Lateral malleolus and medial malleolus tenderness found.       Feet:     Comments:  Right ankle:  Decreased range of motion.  Tenderness and swelling over the lateral malleolus.  Joint stable.  No tenderness over the base of the fifth metatarsal.  Distal neurovascular function is intact.  There is also tenderness to palpation over posterior tibial tendon. Pain elicited by resisted plantar flexion and resisted inversion of the ankle.    Skin:    General: Skin is warm and dry.  Neurological:     Mental Status: She is alert.      UC Treatments / Results  Labs (all labs ordered are listed, but only abnormal results are displayed) Labs Reviewed - No data to display  EKG None  Radiology Dg Ankle Complete Right  Result Date: 07/11/2018 CLINICAL DATA:  38 year old female with ankle pain EXAM: RIGHT ANKLE - COMPLETE 3+ VIEW COMPARISON:  None. FINDINGS: Anterior view demonstrates vague calcific density distal to the fibular head at the lateral ankle, with associated soft tissue swelling. Near is no comparison available. Ankle mortise appears congruent. Questionable joint effusion on the lateral view. Mild degenerative changes of the hindfoot. IMPRESSION: Nonspecific calcific density distal to the fibula may reflect a avulsion fracture/chip fracture, with associated soft tissue swelling. Electronically Signed   By: Corrie Mckusick D.O.   On: 07/11/2018  17:42    Procedures Procedures (including critical care time)  Medications Ordered in UC Medications - No data to display  Initial Impression / Assessment and Plan / UC Course  I have reviewed the triage vital signs and the nursing notes.  Pertinent labs & imaging results that were  available during my care of the patient were reviewed by me and considered in my medical decision making (see chart for details).    Suspect sprain of right posterior tendon also. Applied ace wrap.  Dispensed crutches. Followup with Dr. Aundria Mems ([facility]) in about 2 days for fracture management.  Final Clinical Impressions(s) / UC Diagnoses   Final diagnoses:  Sprain of right ankle, unspecified ligament, initial encounter  Avulsion fracture of distal end of fibula     Discharge Instructions     Apply ice pack for 30 minutes every 1 to 2 hours today and tomorrow.  Elevate.  Use crutches (no weight bearing) until follow-up by Dr. Aundria Mems.  May take Tylenol as needed for pain.    ED Prescriptions    None         Kandra Nicolas, MD 07/16/18 [phone]

## 2018-07-11 NOTE — Discharge Instructions (Addendum)
Apply ice pack for 30 minutes every 1 to 2 hours today and tomorrow.  Elevate.  Use crutches (no weight bearing) until follow-up by Dr. Aundria Mems.  May take Tylenol as needed for pain.

## 2018-07-13 ENCOUNTER — Ambulatory Visit (INDEPENDENT_AMBULATORY_CARE_PROVIDER_SITE_OTHER): Payer: 59 | Admitting: Sports Medicine

## 2018-07-13 DIAGNOSIS — F4323 Adjustment disorder with mixed anxiety and depressed mood: Secondary | ICD-10-CM | POA: Diagnosis not present

## 2018-07-13 DIAGNOSIS — S82831A Other fracture of upper and lower end of right fibula, initial encounter for closed fracture: Secondary | ICD-10-CM | POA: Diagnosis not present

## 2018-07-13 DIAGNOSIS — S82401A Unspecified fracture of shaft of right fibula, initial encounter for closed fracture: Secondary | ICD-10-CM | POA: Insufficient documentation

## 2018-07-13 MED ORDER — ALPRAZOLAM 0.5 MG PO TABS: 0.5000 mg | ORAL_TABLET | Freq: Two times a day (BID) | ORAL | 0 refills | Status: DC | PRN

## 2018-07-13 MED ORDER — TRAZODONE HCL 100 MG PO TABS: 200.0000 mg | ORAL_TABLET | Freq: Every day | ORAL | 3 refills | Status: DC

## 2018-07-13 MED ORDER — CITALOPRAM HYDROBROMIDE 10 MG PO TABS: 10.0000 mg | ORAL_TABLET | Freq: Every day | ORAL | 3 refills | Status: DC

## 2018-07-13 NOTE — Assessment & Plan Note (Signed)
Unable to get Trintellix or do therapy due to high co-pays. Intolerances to Lexapro and Zoloft in the past. Starting Celexa. Refilling Xanax. Increasing trazodone to 200 mg. Repeat PHQ/GAD in 1 month.

## 2018-07-13 NOTE — Assessment & Plan Note (Signed)
Cast boot. Nonweightbearing for 2 weeks, then partial weightbearing as tolerated maybe with a single crutch. Return to see me in 4 weeks, no x-ray needed. She only wants to use Tylenol for pain.  I billed a fracture code for this encounter, all subsequent visits will be post-op checks in the global period.

## 2018-07-13 NOTE — Progress Notes (Signed)
Subjective:    CC: Multiple issues  HPI: Foot pain: Earlier this week fell in a hole while mowing her grass, had immediate pain, swelling, bruising over the lateral ankle.  Seen in urgent care, x-rays showed a fracture.  Persistent pain.  Localized without radiation.  Anxiety and depression: Currently on trazodone 150 which is not helping her sleep sufficiently, Wellbutrin, and still has significant anxiety.  We tried Trintellix, this was too expensive, they have-year-old therapy was also too expensive for her.  She is agreeable to try something like Celexa.  No suicidal or homicidal ideation.  I reviewed the past medical history, family history, social history, surgical history, and allergies today and no changes were needed.  Please see the problem list section below in epic for further details.  Past Medical History: No past medical history on file. Past Surgical History: Past Surgical History:  Procedure Laterality Date  . TUBAL LIGATION     Social History: Social History   Socioeconomic History  . Marital status: Divorced    Spouse name: Not on file  . Number of children: Not on file  . Years of education: Not on file  . Highest education level: Not on file  Occupational History  . Not on file  Social Needs  . Financial resource strain: Not on file  . Food insecurity:    Worry: Not on file    Inability: Not on file  . Transportation needs:    Medical: Not on file    Non-medical: Not on file  Tobacco Use  . Smoking status: Never Smoker  . Smokeless tobacco: Never Used  Substance and Sexual Activity  . Alcohol use: Yes  . Drug use: No  . Sexual activity: Not on file  Lifestyle  . Physical activity:    Days per week: Not on file    Minutes per session: Not on file  . Stress: Not on file  Relationships  . Social connections:    Talks on phone: Not on file    Gets together: Not on file    Attends religious service: Not on file    Active member of club or  organization: Not on file    Attends meetings of clubs or organizations: Not on file    Relationship status: Not on file  Other Topics Concern  . Not on file  Social History Narrative  . Not on file   Family History: Family History  Problem Relation Age of Onset  . Cancer Mother        cervical CA   Allergies: Allergies  Allergen Reactions  . Codeine   . Sulfur    Medications: See med rec.  Review of Systems: No fevers, chills, night sweats, weight loss, chest pain, or shortness of breath.   Objective:    General: Well Developed, well nourished, and in no acute distress.  Neuro: Alert and oriented x3, extra-ocular muscles intact, sensation grossly intact.  HEENT: Normocephalic, atraumatic, pupils equal round reactive to light, neck supple, no masses, no lymphadenopathy, thyroid nonpalpable.  Skin: Warm and dry, no rashes. Cardiac: Regular rate and rhythm, no murmurs rubs or gallops, no lower extremity edema.  Respiratory: Clear to auscultation bilaterally. Not using accessory muscles, speaking in full sentences. Right ankle: Mild swelling Range of motion is full in all directions. Strength is 5/5 in all directions. Stable lateral and medial ligaments; squeeze test and kleiger test unremarkable; Talar dome nontender; No pain at base of 5th MT; No tenderness over cuboid; No  tenderness over N spot or navicular prominence Tender to palpation of the tip of the lateral malleolus No sign of peroneal tendon subluxations; Negative tarsal tunnel tinel's Able to walk 4 steps.  X-rays personally reviewed and show a small avulsion from the distal tip of the fibula.  Impression and Recommendations:    Fracture of fibula, right, closed Cast boot. Nonweightbearing for 2 weeks, then partial weightbearing as tolerated maybe with a single crutch. Return to see me in 4 weeks, no x-ray needed. She only wants to use Tylenol for pain.  I billed a fracture code for this encounter, all  subsequent visits will be post-op checks in the global period.  Adjustment disorder with mixed anxiety and depressed mood Unable to get Trintellix or do therapy due to high co-pays. Intolerances to Lexapro and Zoloft in the past. Starting Celexa. Refilling Xanax. Increasing trazodone to 200 mg. Repeat PHQ/GAD in 1 month.   ___________________________________________ Gwen Her. Dianah Field, M.D., ABFM., CAQSM. Primary Care and Sports Medicine [place] [facility]  Adjunct Professor of [place] [employer] [facility] [employer] Medicine

## 2018-07-17 ENCOUNTER — Ambulatory Visit: Payer: 59 | Admitting: Sports Medicine

## 2018-07-25 ENCOUNTER — Other Ambulatory Visit: Payer: Self-pay | Admitting: Sports Medicine

## 2018-07-25 DIAGNOSIS — I10 Essential (primary) hypertension: Secondary | ICD-10-CM

## 2018-08-09 ENCOUNTER — Other Ambulatory Visit: Payer: Self-pay | Admitting: Sports Medicine

## 2018-08-09 DIAGNOSIS — I10 Essential (primary) hypertension: Secondary | ICD-10-CM

## 2018-08-09 DIAGNOSIS — R635 Abnormal weight gain: Secondary | ICD-10-CM

## 2018-08-14 ENCOUNTER — Other Ambulatory Visit: Payer: Self-pay | Admitting: Sports Medicine

## 2018-08-14 ENCOUNTER — Ambulatory Visit: Payer: 59 | Admitting: Sports Medicine

## 2018-08-14 DIAGNOSIS — F4323 Adjustment disorder with mixed anxiety and depressed mood: Secondary | ICD-10-CM

## 2018-09-04 ENCOUNTER — Encounter: Payer: Self-pay | Admitting: Sports Medicine

## 2018-09-04 ENCOUNTER — Ambulatory Visit (INDEPENDENT_AMBULATORY_CARE_PROVIDER_SITE_OTHER): Payer: 59 | Admitting: Sports Medicine

## 2018-09-04 DIAGNOSIS — F4323 Adjustment disorder with mixed anxiety and depressed mood: Secondary | ICD-10-CM | POA: Diagnosis not present

## 2018-09-04 DIAGNOSIS — S82831D Other fracture of upper and lower end of right fibula, subsequent encounter for closed fracture with routine healing: Secondary | ICD-10-CM | POA: Diagnosis not present

## 2018-09-04 DIAGNOSIS — R635 Abnormal weight gain: Secondary | ICD-10-CM

## 2018-09-04 MED ORDER — ALPRAZOLAM 0.5 MG PO TABS: 0.5000 mg | ORAL_TABLET | Freq: Two times a day (BID) | ORAL | 0 refills | Status: DC | PRN

## 2018-09-04 MED ORDER — CITALOPRAM HYDROBROMIDE 20 MG PO TABS: 20.0000 mg | ORAL_TABLET | Freq: Every day | ORAL | 3 refills | Status: DC

## 2018-09-04 NOTE — Assessment & Plan Note (Signed)
Significantly improved on Celexa 10 mg, increasing to 20. Refilling Xanax, continue trazodone at 200 mg. Continue Wellbutrin 150. Unable to do Trintellix or do therapy due to high co-pays, and has had intolerances to Lexapro and Zoloft in the past Follow-up in 6 weeks for a PHQ/gad, Doximity.

## 2018-09-04 NOTE — Assessment & Plan Note (Signed)
Nontender over fracture, clinically healed, discontinue boot and return as needed.

## 2018-09-04 NOTE — Assessment & Plan Note (Signed)
We can discuss phentermine at the follow-up visit on Doximity

## 2018-09-04 NOTE — Progress Notes (Signed)
Subjective:    CC: Follow-up  HPI: Anxiety and depression/adjustment disorder: Improved with Celexa, continues with Wellbutrin, trazodone at night, and occasional Xanax.  No suicidal or homicidal ideation.  Fibular fracture: Now pain-free, out of the boot.  I reviewed the past medical history, family history, social history, surgical history, and allergies today and no changes were needed.  Please see the problem list section below in epic for further details.  Past Medical History: No past medical history on file. Past Surgical History: Past Surgical History:  Procedure Laterality Date  . TUBAL LIGATION     Social History: Social History   Socioeconomic History  . Marital status: Divorced    Spouse name: Not on file  . Number of children: Not on file  . Years of education: Not on file  . Highest education level: Not on file  Occupational History  . Not on file  Social Needs  . Financial resource strain: Not on file  . Food insecurity:    Worry: Not on file    Inability: Not on file  . Transportation needs:    Medical: Not on file    Non-medical: Not on file  Tobacco Use  . Smoking status: Never Smoker  . Smokeless tobacco: Never Used  Substance and Sexual Activity  . Alcohol use: Yes  . Drug use: No  . Sexual activity: Not on file  Lifestyle  . Physical activity:    Days per week: Not on file    Minutes per session: Not on file  . Stress: Not on file  Relationships  . Social connections:    Talks on phone: Not on file    Gets together: Not on file    Attends religious service: Not on file    Active member of club or organization: Not on file    Attends meetings of clubs or organizations: Not on file    Relationship status: Not on file  Other Topics Concern  . Not on file  Social History Narrative  . Not on file   Family History: Family History  Problem Relation Age of Onset  . Cancer Mother        cervical CA   Allergies: Allergies  Allergen  Reactions  . Codeine   . Sulfur    Medications: See med rec.  Review of Systems: No fevers, chills, night sweats, weight loss, chest pain, or shortness of breath.   Objective:    General: Well Developed, well nourished, and in no acute distress.  Neuro: Alert and oriented x3, extra-ocular muscles intact, sensation grossly intact.  HEENT: Normocephalic, atraumatic, pupils equal round reactive to light, neck supple, no masses, no lymphadenopathy, thyroid nonpalpable.  Skin: Warm and dry, no rashes. Cardiac: Regular rate and rhythm, no murmurs rubs or gallops, no lower extremity edema.  Respiratory: Clear to auscultation bilaterally. Not using accessory muscles, speaking in full sentences. Right ankle: No visible erythema or swelling. Range of motion is full in all directions. Strength is 5/5 in all directions. Stable lateral and medial ligaments; squeeze test and kleiger test unremarkable; Talar dome nontender; No pain at base of 5th MT; No tenderness over cuboid; No tenderness over N spot or navicular prominence No tenderness on posterior aspects of lateral and medial malleolus No sign of peroneal tendon subluxations; Negative tarsal tunnel tinel's Able to walk 4 steps.  Impression and Recommendations:    Adjustment disorder with mixed anxiety and depressed mood Significantly improved on Celexa 10 mg, increasing to 20. Refilling Xanax,  continue trazodone at 200 mg. Continue Wellbutrin 150. Unable to do Trintellix or do therapy due to high co-pays, and has had intolerances to Lexapro and Zoloft in the past Follow-up in 6 weeks for a PHQ/gad, Doximity.  Fracture of fibula, right, closed Nontender over fracture, clinically healed, discontinue boot and return as needed.  Abnormal weight gain We can discuss phentermine at the follow-up visit on Doximity   ___________________________________________ Gwen Her. Dianah Field, M.D., ABFM., CAQSM. Primary Care and Sports Medicine  [facility]  Adjunct Professor of [place] of [facility] of Medicine

## 2018-09-20 ENCOUNTER — Other Ambulatory Visit: Payer: Self-pay | Admitting: Sports Medicine

## 2018-09-20 DIAGNOSIS — R635 Abnormal weight gain: Secondary | ICD-10-CM

## 2018-09-20 DIAGNOSIS — E282 Polycystic ovarian syndrome: Secondary | ICD-10-CM

## 2018-10-16 ENCOUNTER — Ambulatory Visit (INDEPENDENT_AMBULATORY_CARE_PROVIDER_SITE_OTHER): Payer: 59 | Admitting: Sports Medicine

## 2018-10-16 ENCOUNTER — Encounter: Payer: Self-pay | Admitting: Sports Medicine

## 2018-10-16 DIAGNOSIS — F4323 Adjustment disorder with mixed anxiety and depressed mood: Secondary | ICD-10-CM | POA: Diagnosis not present

## 2018-10-16 DIAGNOSIS — R635 Abnormal weight gain: Secondary | ICD-10-CM | POA: Diagnosis not present

## 2018-10-16 MED ORDER — PHENTERMINE HCL 37.5 MG PO TABS: ORAL_TABLET | ORAL | 0 refills | Status: DC

## 2018-10-16 NOTE — Progress Notes (Signed)
Virtual Visit via Telephone   I connected with  Delynn Flavin  on 10/16/18 by telephone/telehealth and verified that I am speaking with the correct person using two identifiers.   I discussed the limitations, risks, security and privacy concerns of performing an evaluation and management service by telephone, including the higher likelihood of inaccurate diagnosis and treatment, and the availability of in person appointments.  We also discussed the likely need of an additional face to face encounter for complete and high quality delivery of care.  I also discussed with the patient that there may be a patient responsible charge related to this service. The patient expressed understanding and wishes to proceed.  Provider location is either at home or medical facility. Patient location is at their home, different from provider location. People involved in care of the patient during this telehealth encounter were myself, my nurse/medical assistant, and my front office/scheduling team member.  Subjective:    CC: Follow-up  HPI: Anxiety and depression: Greatly improved with the increase to 20 mg of Celexa.  Abnormal weight gain: Would still like to try phentermine, goal weight is 120 pounds.  I reviewed the past medical history, family history, social history, surgical history, and allergies today and no changes were needed.  Please see the problem list section below in epic for further details.  Past Medical History: No past medical history on file. Past Surgical History: Past Surgical History:  Procedure Laterality Date  . TUBAL LIGATION     Social History: Social History   Socioeconomic History  . Marital status: Divorced    Spouse name: Not on file  . Number of children: Not on file  . Years of education: Not on file  . Highest education level: Not on file  Occupational History  . Not on file  Social Needs  . Financial resource strain: Not on file  . Food insecurity   Worry: Not on file    Inability: Not on file  . Transportation needs    Medical: Not on file    Non-medical: Not on file  Tobacco Use  . Smoking status: Never Smoker  . Smokeless tobacco: Never Used  Substance and Sexual Activity  . Alcohol use: Yes  . Drug use: No  . Sexual activity: Not on file  Lifestyle  . Physical activity    Days per week: Not on file    Minutes per session: Not on file  . Stress: Not on file  Relationships  . Social Herbalist on phone: Not on file    Gets together: Not on file    Attends religious service: Not on file    Active member of club or organization: Not on file    Attends meetings of clubs or organizations: Not on file    Relationship status: Not on file  Other Topics Concern  . Not on file  Social History Narrative  . Not on file   Family History: Family History  Problem Relation Age of Onset  . Cancer Mother        cervical CA   Allergies: Allergies  Allergen Reactions  . Codeine   . Sulfur    Medications: See med rec.  Review of Systems: No fevers, chills, night sweats, weight loss, chest pain, or shortness of breath.   Objective:    General: Speaking full sentences, no audible heavy breathing.  Sounds alert and appropriately interactive.  No other physical exam performed due to the non-face to  face nature of this visit.  Impression and Recommendations:    Adjustment disorder with mixed anxiety and depressed mood Good improvements, still with a bit of baseline anxiety but she is okay with this. No changes in plan, we did increase to 20 of Celexa at the last visit. She will continue with trazodone 200 at bedtime, occasional Xanax and Wellbutrin 150.   Abnormal weight gain Starting phentermine per patient request, her goal weight is 120 to 125 pounds. Return monthly for weight checks and refills. If she plateaus we can switch to intermittent fasting.   I discussed the above assessment and treatment plan with  the patient. The patient was provided an opportunity to ask questions and all were answered. The patient agreed with the plan and demonstrated an understanding of the instructions.   The patient was advised to call back or seek an in-person evaluation if the symptoms worsen or if the condition fails to improve as anticipated.   I provided 25 minutes of non-face-to-face time during this encounter, 15 minutes of additional time was needed to gather information, review chart, records, communicate/coordinate with staff remotely, and complete documentation.   ___________________________________________ Gwen Her. Dianah Field, M.D., ABFM., CAQSM. Primary Care and Sports Medicine [facility]  Adjunct Professor of [place] of [facility] of Medicine

## 2018-10-16 NOTE — Assessment & Plan Note (Signed)
Starting phentermine per patient request, her goal weight is 120 to 125 pounds. Return monthly for weight checks and refills. If she plateaus we can switch to intermittent fasting.

## 2018-10-16 NOTE — Assessment & Plan Note (Signed)
Good improvements, still with a bit of baseline anxiety but she is okay with this. No changes in plan, we did increase to 20 of Celexa at the last visit. She will continue with trazodone 200 at bedtime, occasional Xanax and Wellbutrin 150.

## 2018-11-06 ENCOUNTER — Other Ambulatory Visit: Payer: Self-pay | Admitting: Sports Medicine

## 2018-11-06 DIAGNOSIS — R635 Abnormal weight gain: Secondary | ICD-10-CM

## 2018-11-06 DIAGNOSIS — I10 Essential (primary) hypertension: Secondary | ICD-10-CM

## 2018-11-06 DIAGNOSIS — E282 Polycystic ovarian syndrome: Secondary | ICD-10-CM

## 2019-01-18 ENCOUNTER — Other Ambulatory Visit: Payer: Self-pay | Admitting: Sports Medicine

## 2019-01-18 DIAGNOSIS — R635 Abnormal weight gain: Secondary | ICD-10-CM

## 2019-01-18 DIAGNOSIS — E282 Polycystic ovarian syndrome: Secondary | ICD-10-CM

## 2019-01-18 DIAGNOSIS — I10 Essential (primary) hypertension: Secondary | ICD-10-CM

## 2019-02-27 ENCOUNTER — Other Ambulatory Visit: Payer: Self-pay | Admitting: Sports Medicine

## 2019-02-27 DIAGNOSIS — E282 Polycystic ovarian syndrome: Secondary | ICD-10-CM

## 2019-02-27 DIAGNOSIS — I10 Essential (primary) hypertension: Secondary | ICD-10-CM

## 2019-02-27 DIAGNOSIS — R635 Abnormal weight gain: Secondary | ICD-10-CM

## 2019-03-05 ENCOUNTER — Ambulatory Visit (INDEPENDENT_AMBULATORY_CARE_PROVIDER_SITE_OTHER): Payer: 59 | Admitting: Sports Medicine

## 2019-03-05 ENCOUNTER — Encounter: Payer: Self-pay | Admitting: Sports Medicine

## 2019-03-05 DIAGNOSIS — R635 Abnormal weight gain: Secondary | ICD-10-CM | POA: Diagnosis not present

## 2019-03-05 DIAGNOSIS — I10 Essential (primary) hypertension: Secondary | ICD-10-CM

## 2019-03-05 DIAGNOSIS — E282 Polycystic ovarian syndrome: Secondary | ICD-10-CM

## 2019-03-05 DIAGNOSIS — F4323 Adjustment disorder with mixed anxiety and depressed mood: Secondary | ICD-10-CM | POA: Diagnosis not present

## 2019-03-05 DIAGNOSIS — Z6829 Body mass index (BMI) 29.0-29.9, adult: Secondary | ICD-10-CM | POA: Diagnosis not present

## 2019-03-05 MED ORDER — PHENTERMINE HCL 37.5 MG PO TABS: ORAL_TABLET | ORAL | 0 refills | Status: DC

## 2019-03-05 MED ORDER — LISINOPRIL 5 MG PO TABS: 5.0000 mg | ORAL_TABLET | Freq: Every day | ORAL | 3 refills | Status: DC

## 2019-03-05 MED ORDER — METFORMIN HCL ER 500 MG PO TB24: 500.0000 mg | ORAL_TABLET | Freq: Every day | ORAL | 3 refills | Status: DC

## 2019-03-05 MED ORDER — TOPIRAMATE 50 MG PO TABS: 50.0000 mg | ORAL_TABLET | Freq: Two times a day (BID) | ORAL | 3 refills | Status: DC

## 2019-03-05 MED ORDER — ALPRAZOLAM 0.5 MG PO TABS: 0.5000 mg | ORAL_TABLET | Freq: Two times a day (BID) | ORAL | 3 refills | Status: DC | PRN

## 2019-03-05 MED ORDER — CITALOPRAM HYDROBROMIDE 20 MG PO TABS: 20.0000 mg | ORAL_TABLET | Freq: Every day | ORAL | 3 refills | Status: DC

## 2019-03-05 MED ORDER — BUPROPION HCL ER (XL) 150 MG PO TB24: 150.0000 mg | ORAL_TABLET | Freq: Every day | ORAL | 3 refills | Status: DC

## 2019-03-05 NOTE — Assessment & Plan Note (Signed)
Refilling phentermine, return in 1 month for a weight check and refills.

## 2019-03-05 NOTE — Assessment & Plan Note (Signed)
Well-controlled with current medications, no changes needed.

## 2019-03-05 NOTE — Progress Notes (Signed)
Virtual Visit via Telephone   I connected with  Rose Brooks  on 03/05/19 by telephone/telehealth and verified that I am speaking with the correct person using two identifiers.   I discussed the limitations, risks, security and privacy concerns of performing an evaluation and management service by telephone, including the higher likelihood of inaccurate diagnosis and treatment, and the availability of in person appointments.  We also discussed the likely need of an additional face to face encounter for complete and high quality delivery of care.  I also discussed with the patient that there may be a patient responsible charge related to this service. The patient expressed understanding and wishes to proceed.  Provider location is either at home or medical facility. Patient location is at their home, different from provider location. People involved in care of the patient during this telehealth encounter were myself, my nurse/medical assistant, and my front office/scheduling team member.  Subjective:    CC: Followup  HPI: Doing well, see below.  I reviewed the past medical history, family history, social history, surgical history, and allergies today and no changes were needed.  Please see the problem list section below in epic for further details.  Past Medical History: No past medical history on file. Past Surgical History: Past Surgical History:  Procedure Laterality Date  . TUBAL LIGATION     Social History: Social History   Socioeconomic History  . Marital status: Divorced    Spouse name: Not on file  . Number of children: Not on file  . Years of education: Not on file  . Highest education level: Not on file  Occupational History  . Not on file  Social Needs  . Financial resource strain: Not on file  . Food insecurity    Worry: Not on file    Inability: Not on file  . Transportation needs    Medical: Not on file    Non-medical: Not on file  Tobacco Use  .  Smoking status: Never Smoker  . Smokeless tobacco: Never Used  Substance and Sexual Activity  . Alcohol use: Yes  . Drug use: No  . Sexual activity: Not on file  Lifestyle  . Physical activity    Days per week: Not on file    Minutes per session: Not on file  . Stress: Not on file  Relationships  . Social Herbalist on phone: Not on file    Gets together: Not on file    Attends religious service: Not on file    Active member of club or organization: Not on file    Attends meetings of clubs or organizations: Not on file    Relationship status: Not on file  Other Topics Concern  . Not on file  Social History Narrative  . Not on file   Family History: Family History  Problem Relation Age of Onset  . Cancer Mother        cervical CA   Allergies: Allergies  Allergen Reactions  . Codeine   . Sulfur    Medications: See med rec.  Review of Systems: No fevers, chills, night sweats, weight loss, chest pain, or shortness of breath.   Objective:    General: Speaking full sentences, no audible heavy breathing.  Sounds alert and appropriately interactive.  No other physical exam performed due to the non-face to face nature of this visit.  Impression and Recommendations:    Abnormal weight gain Refilling phentermine, return in 1 month for  a weight check and refills.  Adjustment disorder with mixed anxiety and depressed mood Well-controlled with current medications, no changes needed.   I discussed the above assessment and treatment plan with the patient. The patient was provided an opportunity to ask questions and all were answered. The patient agreed with the plan and demonstrated an understanding of the instructions.   The patient was advised to call back or seek an in-person evaluation if the symptoms worsen or if the condition fails to improve as anticipated.   I provided 25 minutes of non-face-to-face time during this encounter, 15 minutes of additional time  was needed to gather information, review chart, records, communicate/coordinate with staff remotely, and complete documentation.   ___________________________________________ Gwen Her. Dianah Field, M.D., ABFM., CAQSM. Primary Care and Sports Medicine [facility]  Adjunct Professor of [place] of [facility] of Medicine

## 2019-09-02 ENCOUNTER — Other Ambulatory Visit: Payer: Self-pay | Admitting: Sports Medicine

## 2019-09-02 DIAGNOSIS — F4323 Adjustment disorder with mixed anxiety and depressed mood: Secondary | ICD-10-CM

## 2019-09-17 ENCOUNTER — Other Ambulatory Visit: Payer: Self-pay

## 2019-09-17 DIAGNOSIS — R635 Abnormal weight gain: Secondary | ICD-10-CM

## 2019-09-17 NOTE — Telephone Encounter (Signed)
LVM for patient advising why refusal of med request.   Patient lvm stating she wouldn't be able to pick up the phone when we returned her call but lvm.   Advised unable to send Rx until an initial weigh in visit per Dr. Darene Lamer.   Patient states she will have new insurance and be able to schedule in August.  Provided phone number for scheduling appt.

## 2019-09-17 NOTE — Telephone Encounter (Signed)
Ms. Rose Brooks lvm requesting phentermine refill.   She lost her job in February; insurance cancelled.  New insurance available in August.  "I promise I will make my weight check appointments. Please do me the favor of writing the phentermine Rx. I know it's a long shot but I'd like to gain control of my weight before it gets out of control."

## 2019-11-04 ENCOUNTER — Encounter: Payer: Self-pay | Admitting: Sports Medicine

## 2019-11-04 ENCOUNTER — Ambulatory Visit (INDEPENDENT_AMBULATORY_CARE_PROVIDER_SITE_OTHER): Payer: Self-pay | Admitting: Sports Medicine

## 2019-11-04 DIAGNOSIS — Z0000 Encounter for general adult medical examination without abnormal findings: Secondary | ICD-10-CM

## 2019-11-04 DIAGNOSIS — R635 Abnormal weight gain: Secondary | ICD-10-CM

## 2019-11-04 MED ORDER — WEGOVY 0.5 MG/0.5ML ~~LOC~~ SOAJ: 0.5000 mg | SUBCUTANEOUS | 0 refills | Status: DC

## 2019-11-04 MED ORDER — WEGOVY 1 MG/0.5ML ~~LOC~~ SOAJ: 1.0000 mg | SUBCUTANEOUS | 0 refills | Status: DC

## 2019-11-04 MED ORDER — WEGOVY 0.25 MG/0.5ML ~~LOC~~ SOAJ: 0.2500 mg | SUBCUTANEOUS | 0 refills | Status: DC

## 2019-11-04 NOTE — Assessment & Plan Note (Signed)
Up-to-date on cervical cancer screening but needs to get report sent to [place] from [place].

## 2019-11-04 NOTE — Assessment & Plan Note (Signed)
Large amount of weight gain over the past several months, starting [facility], return in 3 months.

## 2019-11-04 NOTE — Progress Notes (Signed)
    Procedures performed today:    None.  Independent interpretation of notes and tests performed by another provider:   None.  Brief History, Exam, Impression, and Recommendations:    Abnormal weight gain Large amount of weight gain over the past several months, starting Wegovy, return in 3 months.  Annual physical exam Up-to-date on cervical cancer screening but needs to get report sent to [place] from [facility].    ___________________________________________ Gwen Her. Dianah Field, M.D., ABFM., CAQSM. Primary Care and [place] Instructor of [place] of [facility] of Medicine

## 2019-11-13 ENCOUNTER — Other Ambulatory Visit: Payer: Self-pay | Admitting: Sports Medicine

## 2019-11-13 DIAGNOSIS — F4323 Adjustment disorder with mixed anxiety and depressed mood: Secondary | ICD-10-CM

## 2019-12-20 ENCOUNTER — Other Ambulatory Visit: Payer: Self-pay | Admitting: Sports Medicine

## 2019-12-20 DIAGNOSIS — F4323 Adjustment disorder with mixed anxiety and depressed mood: Secondary | ICD-10-CM

## 2019-12-20 DIAGNOSIS — R635 Abnormal weight gain: Secondary | ICD-10-CM

## 2020-02-04 ENCOUNTER — Ambulatory Visit (INDEPENDENT_AMBULATORY_CARE_PROVIDER_SITE_OTHER): Payer: BC Managed Care – PPO | Admitting: Sports Medicine

## 2020-02-04 ENCOUNTER — Encounter: Payer: Self-pay | Admitting: Sports Medicine

## 2020-02-04 DIAGNOSIS — R635 Abnormal weight gain: Secondary | ICD-10-CM

## 2020-02-04 MED ORDER — WEGOVY 1.7 MG/0.75ML ~~LOC~~ SOAJ: 1.7000 mg | SUBCUTANEOUS | 0 refills | Status: DC

## 2020-02-04 MED ORDER — WEGOVY 2.4 MG/0.75ML ~~LOC~~ SOAJ: 2.4000 mg | SUBCUTANEOUS | 11 refills | Status: DC

## 2020-02-04 NOTE — Assessment & Plan Note (Signed)
Good weight loss after the first 2 months of Wegovy, I am going to give her the last several doses and she can continue this long-term, follow-up me in 6 months.

## 2020-02-04 NOTE — Progress Notes (Signed)
    Procedures performed today:    None.  Independent interpretation of notes and tests performed by another provider:   None.  Brief History, Exam, Impression, and Recommendations:    Abnormal weight gain Good weight loss after the first 2 months of Wegovy, I am going to give her the last several doses and she can continue this long-term, follow-up me in 6 months.    ___________________________________________ Gwen Her. Dianah Field, M.D., ABFM., CAQSM. Primary Care and [place] Instructor of [place] of [facility] of Medicine

## 2020-02-27 ENCOUNTER — Telehealth: Payer: BC Managed Care – PPO

## 2020-02-27 DIAGNOSIS — R300 Dysuria: Secondary | ICD-10-CM

## 2020-02-27 NOTE — Telephone Encounter (Signed)
Patient called requesting a prescription be called in for a UTI.  She states that she has these all the time and she is certain that is what she has.  She also relates that she doesn't have any PTO left for the rest of the year at work.  Please advise.

## 2020-02-28 DIAGNOSIS — R300 Dysuria: Secondary | ICD-10-CM | POA: Insufficient documentation

## 2020-02-28 MED ORDER — NITROFURANTOIN MONOHYD MACRO 100 MG PO CAPS: 100.0000 mg | ORAL_CAPSULE | Freq: Two times a day (BID) | ORAL | 0 refills | Status: DC

## 2020-02-28 NOTE — Assessment & Plan Note (Signed)
Patient complains of symptoms of urinary tract infection, but declines visit, I have advised her to drop off a sample of urine at the lab before starting [place].  I spent 5 total minutes of online digital evaluation and management services.

## 2020-02-28 NOTE — Telephone Encounter (Signed)
Patient returned call; she will continue taking AZO over the weekend which is keeping symptoms at [place] for now and go to the lab early Monday morning prior to starting [place].

## 2020-02-28 NOTE — Telephone Encounter (Signed)
Left message for a return call from patient.  

## 2020-02-28 NOTE — Telephone Encounter (Signed)
I will send in a prescription for Macrobid but she needs to just drop off a urine sample to the lab BEFORE starting the antibiotic!  Orders placed for everything.

## 2020-04-08 DIAGNOSIS — E668 Other obesity: Secondary | ICD-10-CM | POA: Diagnosis not present

## 2020-04-08 DIAGNOSIS — J0101 Acute recurrent maxillary sinusitis: Secondary | ICD-10-CM | POA: Diagnosis not present

## 2020-04-20 ENCOUNTER — Other Ambulatory Visit: Payer: Self-pay | Admitting: Sports Medicine

## 2020-04-20 DIAGNOSIS — E282 Polycystic ovarian syndrome: Secondary | ICD-10-CM

## 2020-04-20 DIAGNOSIS — R635 Abnormal weight gain: Secondary | ICD-10-CM

## 2020-04-20 DIAGNOSIS — F4323 Adjustment disorder with mixed anxiety and depressed mood: Secondary | ICD-10-CM

## 2020-04-20 DIAGNOSIS — I10 Essential (primary) hypertension: Secondary | ICD-10-CM

## 2020-04-21 ENCOUNTER — Other Ambulatory Visit: Payer: Self-pay | Admitting: Sports Medicine

## 2020-04-21 DIAGNOSIS — F4323 Adjustment disorder with mixed anxiety and depressed mood: Secondary | ICD-10-CM

## 2020-05-01 ENCOUNTER — Other Ambulatory Visit: Payer: Self-pay

## 2020-05-01 DIAGNOSIS — F4323 Adjustment disorder with mixed anxiety and depressed mood: Secondary | ICD-10-CM

## 2020-05-01 MED ORDER — CITALOPRAM HYDROBROMIDE 20 MG PO TABS: 20.0000 mg | ORAL_TABLET | Freq: Every day | ORAL | 0 refills | Status: DC

## 2020-06-01 ENCOUNTER — Telehealth: Payer: Self-pay

## 2020-06-01 ENCOUNTER — Telehealth (INDEPENDENT_AMBULATORY_CARE_PROVIDER_SITE_OTHER): Payer: BC Managed Care – PPO | Admitting: Sports Medicine

## 2020-06-01 DIAGNOSIS — E669 Obesity, unspecified: Secondary | ICD-10-CM | POA: Diagnosis not present

## 2020-06-01 DIAGNOSIS — R635 Abnormal weight gain: Secondary | ICD-10-CM | POA: Diagnosis not present

## 2020-06-01 MED ORDER — SAXENDA 18 MG/3ML ~~LOC~~ SOPN: 3.0000 mg | PEN_INJECTOR | Freq: Every day | SUBCUTANEOUS | 11 refills | Status: DC

## 2020-06-01 NOTE — Addendum Note (Signed)
Addended by: Silverio Decamp on: 06/01/2020 08:57 AM   Modules accepted: Orders

## 2020-06-01 NOTE — Telephone Encounter (Signed)
Patient said she would email insurance. AM

## 2020-06-01 NOTE — Progress Notes (Addendum)
   Virtual Visit via Telephone   I connected with  Rose Brooks  on 06/01/20 by telephone/telehealth and verified that I am speaking with the correct person using two identifiers.   I discussed the limitations, risks, security and privacy concerns of performing an evaluation and management service by telephone, including the higher likelihood of inaccurate diagnosis and treatment, and the availability of in person appointments.  We also discussed the likely need of an additional face to face encounter for complete and high quality delivery of care.  I also discussed with the patient that there may be a patient responsible charge related to this service. The patient expressed understanding and wishes to proceed.  Provider location is in medical facility. Patient location is at their home, different from provider location. People involved in care of the patient during this telehealth encounter were myself, my nurse/medical assistant, and my front office/scheduling team member.  Review of Systems: No fevers, chills, night sweats, weight loss, chest pain, or shortness of breath.   Objective Findings:    General: Speaking full sentences, no audible heavy breathing.  Sounds alert and appropriately interactive.    Independent interpretation of tests performed by another provider:   None.  Brief History, Exam, Impression, and Recommendations:    Obesity (BMI 30-39.9) This is a very pleasant 40 year old female, she has done really well with Wegovy, back in August of last year she was around 210 pounds, today she is down to 165 pounds. Unfortunately the insurance company is no longer going to pay for [facility], it is now approximately $800 a month, we will try switching to [place] for the time being and I would like her to also call her insurance company to see if they have a preferred GLP-1 for weight loss. Because she is on high-dose Wegovy I am going to switch to high-dose Saxenda.   I discussed  the above assessment and treatment plan with the patient. The patient was provided an opportunity to ask questions and all were answered. The patient agreed with the plan and demonstrated an understanding of the instructions.   The patient was advised to call back or seek an in-person evaluation if the symptoms worsen or if the condition fails to improve as anticipated.   I provided 30 minutes of face to face and non-face-to-face time during this encounter date, time was needed to gather information, review chart, records, communicate/coordinate with staff remotely, as well as complete documentation.   ___________________________________________ Gwen Her. Dianah Field, M.D., ABFM., CAQSM. Primary Care and Sports Medicine [place] [facility]  Adjunct Professor of [place] of [facility] of Medicine

## 2020-06-01 NOTE — Assessment & Plan Note (Signed)
 This is a very pleasant 40 year old female, she has done really well with [facility], back in August of last year she was around 210 pounds, today she is down to 165 pounds. Unfortunately the insurance company is no longer going to pay for [facility], it is now approximately $800 a month, we will try switching to Saxenda for the time being and I would like her to also call her insurance company to see if they have a preferred GLP-1 for weight loss. Because she is on high-dose Wegovy I am going to switch to high-dose Saxenda.

## 2020-06-01 NOTE — Telephone Encounter (Signed)
Notification received through Covermymeds that patient's prescription for Saxenda 18mg /24mL needed a PA. Could not complete PA due to to having updated insurance card on file.   Please contact patient and get updated insurance card.

## 2020-06-17 IMAGING — DX RIGHT ANKLE - COMPLETE 3+ VIEW
3 series · 3 of 3 positions shown · non-contrast
Comparison: None.

CLINICAL DATA: 38-year-old female with ankle pain

EXAM:
RIGHT ANKLE - COMPLETE 3+ VIEW

[ankle ap]
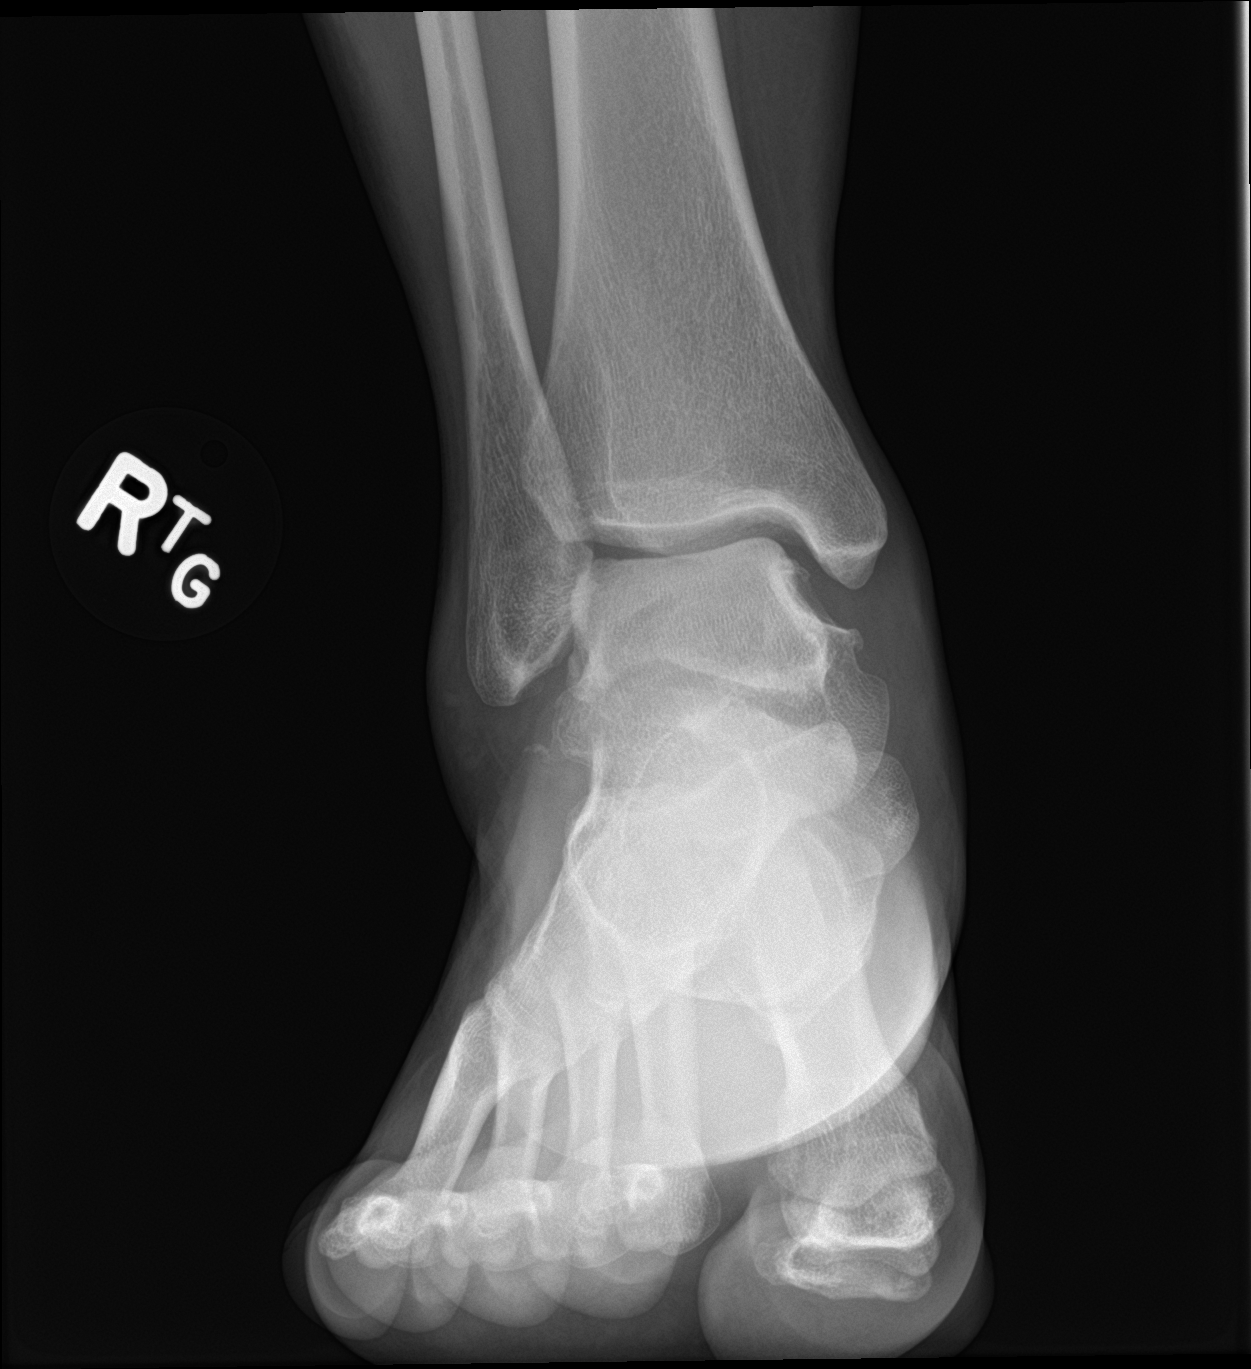

[ankle obl]
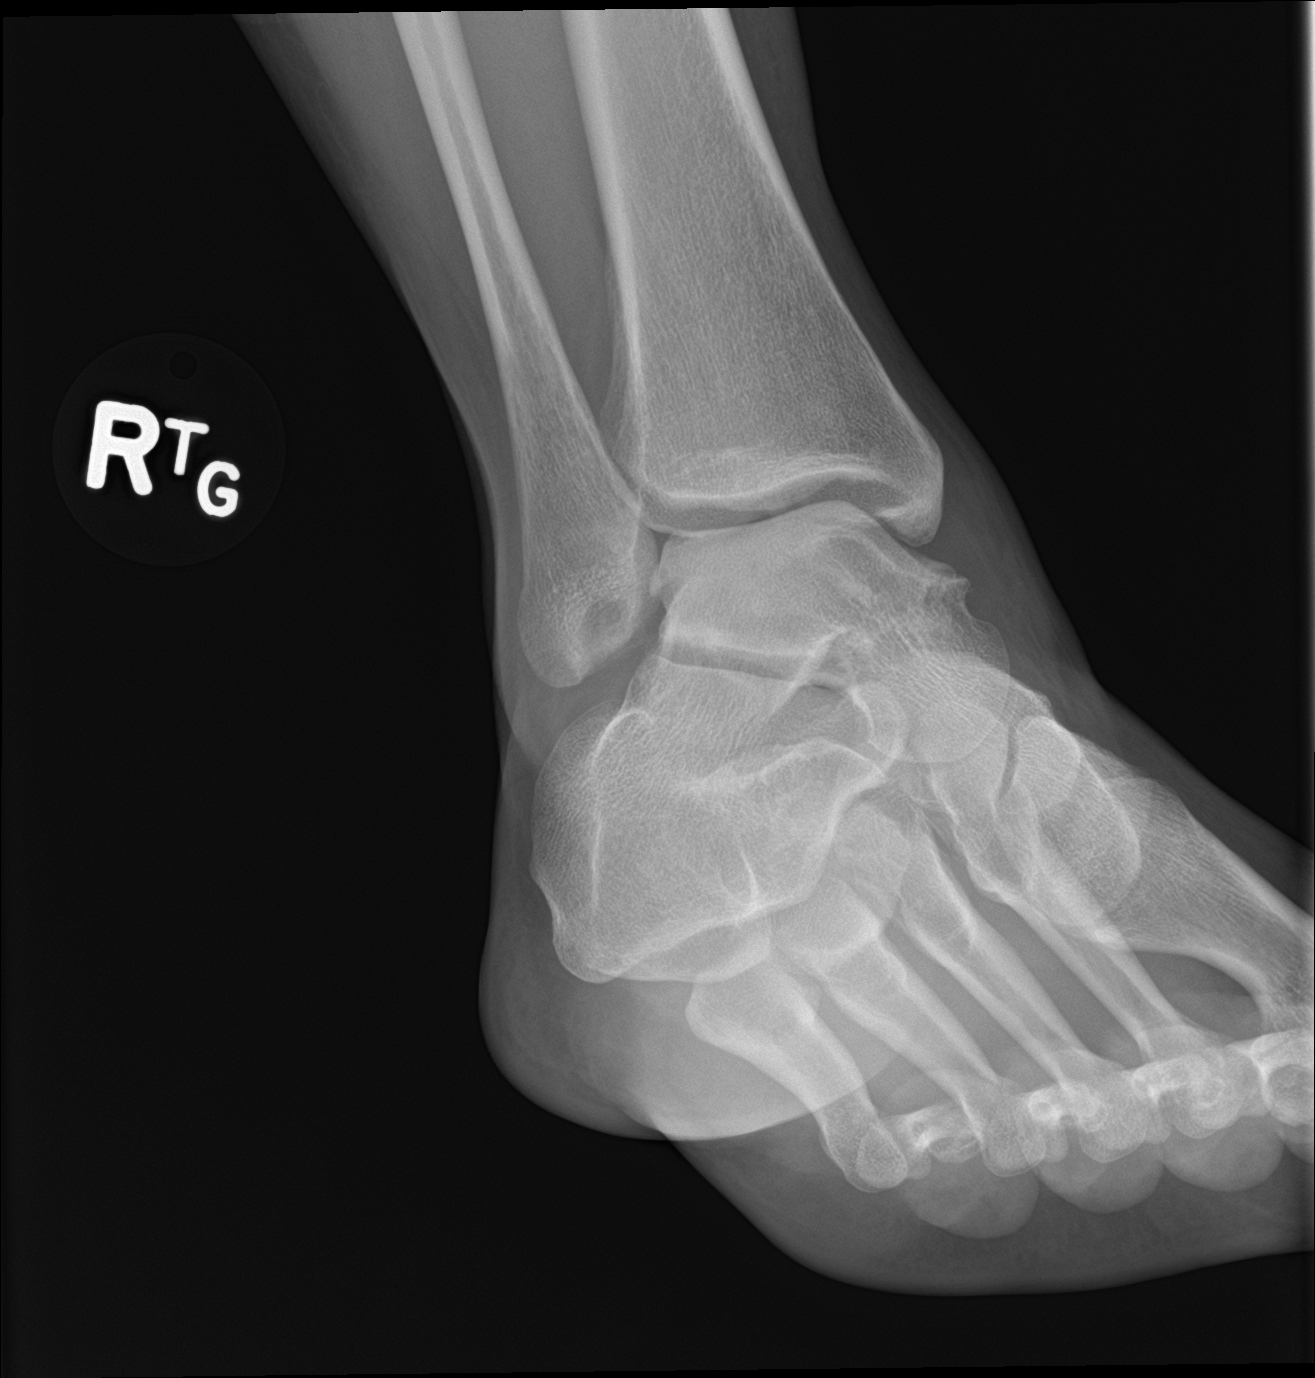

[ankle lat]
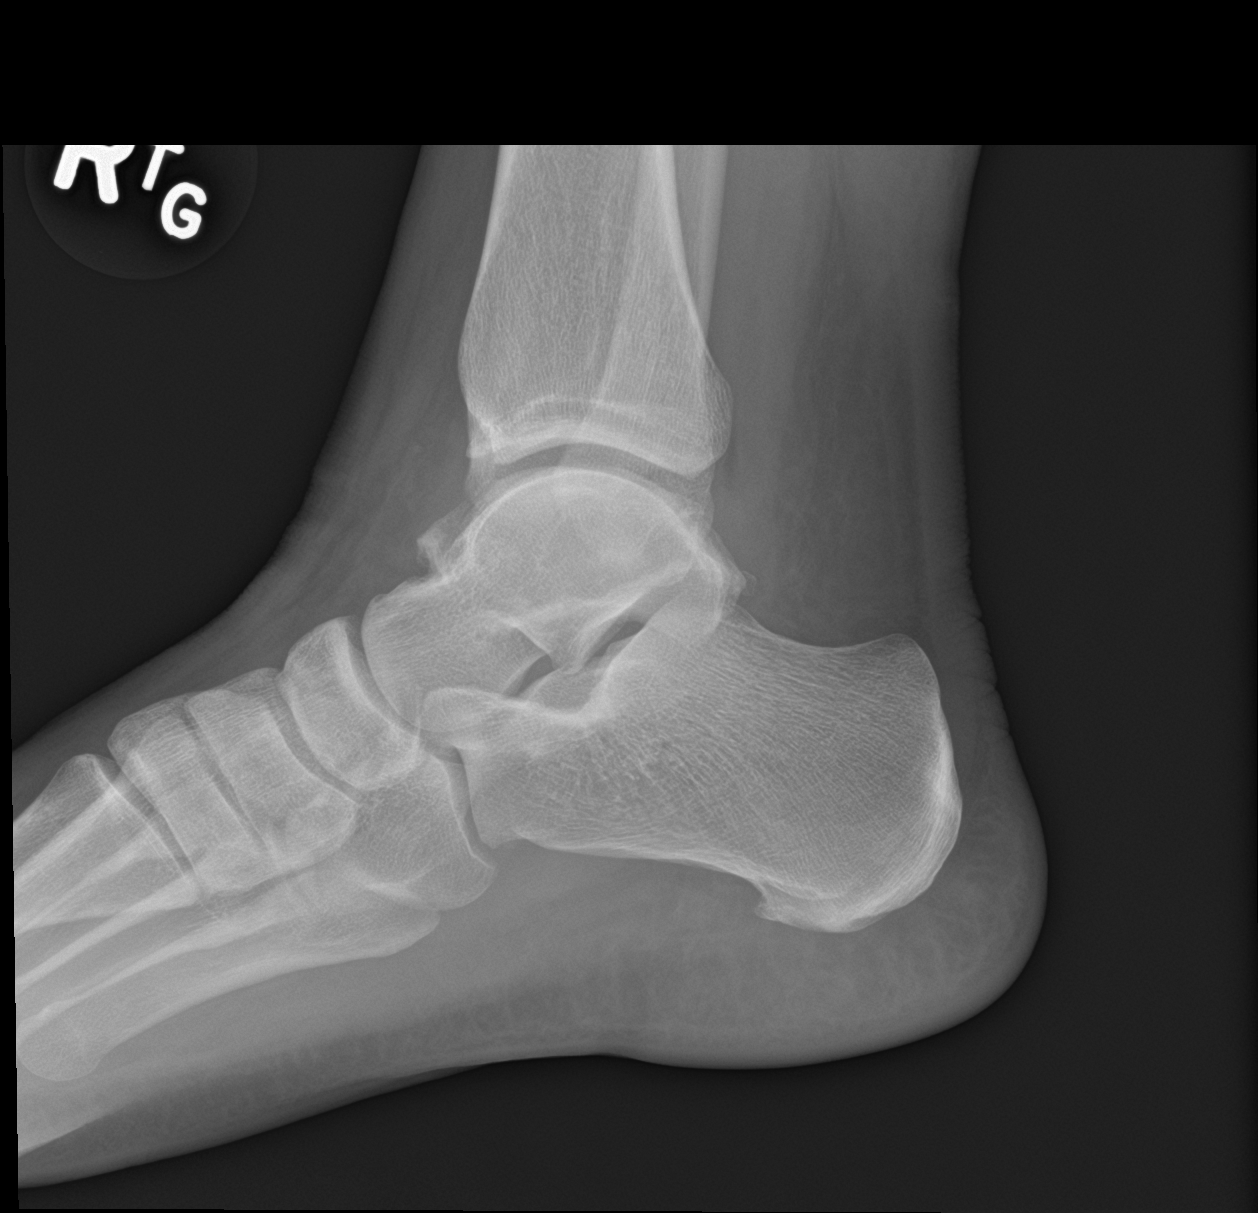

[3 of 3 positions shown; findings below may reference images not displayed]

FINDINGS: Anterior view demonstrates vague calcific density distal to the
fibular head at the lateral ankle, with associated soft tissue
swelling. Near is no comparison available. Ankle mortise appears
congruent.

Questionable joint effusion on the lateral view. Mild degenerative
changes of the hindfoot.
IMPRESSION: Nonspecific calcific density distal to the fibula may reflect a
avulsion fracture/chip fracture, with associated soft tissue
swelling.

## 2020-07-15 ENCOUNTER — Telehealth: Payer: Self-pay

## 2020-07-15 MED ORDER — WEGOVY 2.4 MG/0.75ML ~~LOC~~ SOAJ: 2.4000 mg | SUBCUTANEOUS | 11 refills | Status: DC

## 2020-07-15 NOTE — Telephone Encounter (Signed)
Sent!

## 2020-07-15 NOTE — Telephone Encounter (Signed)
Patient called stating that she got a letter from her insurance that the [facility] was approved. Asked patient to fax this letter to our office as we have not received this information. She was last on the 2.4mg /0.57mL dose. She will need a new prescription sent to [facility].

## 2020-08-04 ENCOUNTER — Ambulatory Visit: Payer: BC Managed Care – PPO | Admitting: Sports Medicine

## 2020-10-21 ENCOUNTER — Other Ambulatory Visit: Payer: Self-pay | Admitting: Sports Medicine

## 2020-10-21 DIAGNOSIS — F4323 Adjustment disorder with mixed anxiety and depressed mood: Secondary | ICD-10-CM

## 2020-12-25 ENCOUNTER — Other Ambulatory Visit: Payer: Self-pay | Admitting: Sports Medicine

## 2020-12-25 DIAGNOSIS — F4323 Adjustment disorder with mixed anxiety and depressed mood: Secondary | ICD-10-CM

## 2021-03-25 ENCOUNTER — Other Ambulatory Visit: Payer: Self-pay | Admitting: Sports Medicine

## 2021-03-25 DIAGNOSIS — F4323 Adjustment disorder with mixed anxiety and depressed mood: Secondary | ICD-10-CM

## 2021-04-07 DIAGNOSIS — M542 Cervicalgia: Secondary | ICD-10-CM | POA: Diagnosis not present

## 2021-07-02 DIAGNOSIS — R319 Hematuria, unspecified: Secondary | ICD-10-CM | POA: Diagnosis not present

## 2021-07-02 DIAGNOSIS — R300 Dysuria: Secondary | ICD-10-CM | POA: Diagnosis not present

## 2021-07-02 DIAGNOSIS — N3001 Acute cystitis with hematuria: Secondary | ICD-10-CM | POA: Diagnosis not present

## 2021-07-12 ENCOUNTER — Other Ambulatory Visit: Payer: Self-pay | Admitting: Sports Medicine

## 2021-07-13 ENCOUNTER — Ambulatory Visit: Payer: BC Managed Care – PPO | Admitting: Sports Medicine

## 2021-07-13 VITALS — BP 154/109 | HR 82 | Ht 60.0 in | Wt 144.0 lb

## 2021-07-13 DIAGNOSIS — I10 Essential (primary) hypertension: Secondary | ICD-10-CM

## 2021-07-13 DIAGNOSIS — F4323 Adjustment disorder with mixed anxiety and depressed mood: Secondary | ICD-10-CM

## 2021-07-13 DIAGNOSIS — E611 Iron deficiency: Secondary | ICD-10-CM

## 2021-07-13 DIAGNOSIS — E538 Deficiency of other specified B group vitamins: Secondary | ICD-10-CM

## 2021-07-13 DIAGNOSIS — E669 Obesity, unspecified: Secondary | ICD-10-CM | POA: Diagnosis not present

## 2021-07-13 DIAGNOSIS — Z0000 Encounter for general adult medical examination without abnormal findings: Secondary | ICD-10-CM

## 2021-07-13 NOTE — Assessment & Plan Note (Signed)
Annual physical as above, up-to-date on screenings except for Pap smear, she is seeing her gynecologist in May. ?

## 2021-07-13 NOTE — Assessment & Plan Note (Signed)
Blood pressure is elevated today, she will check it at home and send me a diary over the next week or so, we will titrate her lisinopril based on that. ?

## 2021-07-13 NOTE — Assessment & Plan Note (Signed)
Fantastic improvements in weight on Wegovy. ?She is feeling a little bit tired/low energy.  We will do a bit of a work-up though I think this is because she is in a calorie negative state. ?Mood is controlled, sleeping well. ?

## 2021-07-13 NOTE — Progress Notes (Addendum)
?Subjective:   ? ?CC: Annual Physical Exam ? ?HPI:  ?This patient is here for their annual physical ? ?I reviewed the past medical history, family history, social history, surgical history, and allergies today and no changes were needed.  Please see the problem list section below in epic for further details. ? ?Past Medical History: ?No past medical history on file. ?Past Surgical History: ?Past Surgical History:  ?Procedure Laterality Date  ? TUBAL LIGATION    ? ?Social History: ?Social History  ? ?Socioeconomic History  ? Marital status: Divorced  ?  Spouse name: Not on file  ? Number of children: Not on file  ? Years of education: Not on file  ? Highest education level: Not on file  ?Occupational History  ? Not on file  ?Tobacco Use  ? Smoking status: Never  ? Smokeless tobacco: Never  ?Substance and Sexual Activity  ? Alcohol use: Yes  ? Drug use: No  ? Sexual activity: Not on file  ?Other Topics Concern  ? Not on file  ?Social History Narrative  ? Not on file  ? ?Social Determinants of Health  ? ?Financial Resource Strain: Not on file  ?Food Insecurity: Not on file  ?Transportation Needs: Not on file  ?Physical Activity: Not on file  ?Stress: Not on file  ?Social Connections: Not on file  ? ?Family History: ?Family History  ?Problem Relation Age of Onset  ? Cancer Mother   ?     cervical CA  ? ?Allergies: ?Allergies  ?Allergen Reactions  ? Codeine   ? Elemental Sulfur   ? ?Medications: See med rec. ? ?Review of Systems: No headache, visual changes, nausea, vomiting, diarrhea, constipation, dizziness, abdominal pain, skin rash, fevers, chills, night sweats, swollen lymph nodes, weight loss, chest pain, body aches, joint swelling, muscle aches, shortness of breath, mood changes, visual or auditory hallucinations. ? ?Objective:   ? ?General: Well Developed, well nourished, and in no acute distress.  ?Neuro: Alert and oriented x3, extra-ocular muscles intact, sensation grossly intact. Cranial nerves II through XII  are intact, motor, sensory, and coordinative functions are all intact. ?HEENT: Normocephalic, atraumatic, pupils equal round reactive to light, neck supple, no masses, no lymphadenopathy, thyroid nonpalpable. Oropharynx, nasopharynx, external ear canals are unremarkable. ?Skin: Warm and dry, no rashes noted.  ?Cardiac: Regular rate and rhythm, no murmurs rubs or gallops.  ?Respiratory: Clear to auscultation bilaterally. Not using accessory muscles, speaking in full sentences.  ?Abdominal: Soft, nontender, nondistended, positive bowel sounds, no masses, no organomegaly.  ?Musculoskeletal: Shoulder, elbow, wrist, hip, knee, ankle stable, and with full range of motion. ? ?Impression and Recommendations:   ? ?The patient was counselled, risk factors were discussed, anticipatory guidance given. ? ?Adjustment disorder with mixed anxiety and depressed mood ?Overall doing well, continue current medications, Wellbutrin, Celexa, alprazolam. ? ?Obesity (BMI 30-39.9) ?Fantastic improvements in weight on Wegovy. ?She is feeling a little bit tired/low energy.  We will do a bit of a work-up though I think this is because she is in a calorie negative state. ?Mood is controlled, sleeping well. ? ?Annual physical exam ?Annual physical as above, up-to-date on screenings except for Pap smear, she is seeing her gynecologist in May. ? ?Essential hypertension, benign ?Blood pressure is elevated today, she will check it at home and send me a diary over the next week or so, we will titrate her lisinopril based on that. ? ?Iron deficiency without anemia ?Not yet anemic but iron indices are low, saturation is low, and  ferritin levels are low suggesting iron deficiency without overt anemia, B12 is also in the bottom of the normal range, my advice would be to do 1000 mcg of B12 once daily and an iron supplement (325 mg twice daily). ?This may explain some of her fatigue. ? ?___________________________________________ ?Gwen Her. Dianah Field,  M.D., ABFM., CAQSM. ?Primary Care and Sports Medicine ?[place] ? ?Adjunct Professor of Family Medicine  ?[employer] ?

## 2021-07-13 NOTE — Assessment & Plan Note (Signed)
Overall doing well, continue current medications, Wellbutrin, Celexa, alprazolam. ?

## 2021-07-14 DIAGNOSIS — E611 Iron deficiency: Secondary | ICD-10-CM | POA: Insufficient documentation

## 2021-07-14 LAB — COMPREHENSIVE METABOLIC PANEL
AG Ratio: 1.6 (calc) (ref 1.0–2.5)
ALT: 20 U/L (ref 6–29)
AST: 21 U/L (ref 10–30)
Albumin: 4.4 g/dL (ref 3.6–5.1)
Alkaline phosphatase (APISO): 59 U/L (ref 31–125)
BUN: 11 mg/dL (ref 7–25)
CO2: 22 mmol/L (ref 20–32)
Calcium: 9.6 mg/dL (ref 8.6–10.2)
Chloride: 108 mmol/L (ref 98–110)
Creat: 0.92 mg/dL (ref 0.50–0.99)
Globulin: 2.8 g/dL (calc) (ref 1.9–3.7)
Glucose, Bld: 101 mg/dL — ABNORMAL HIGH (ref 65–99)
Potassium: 4.3 mmol/L (ref 3.5–5.3)
Sodium: 140 mmol/L (ref 135–146)
Total Bilirubin: 0.6 mg/dL (ref 0.2–1.2)
Total Protein: 7.2 g/dL (ref 6.1–8.1)

## 2021-07-14 LAB — HEMOGLOBIN A1C
Hgb A1c MFr Bld: 5.1 % of total Hgb (ref <5.7)
Mean Plasma Glucose: 100 mg/dL
eAG (mmol/L): 5.5 mmol/L

## 2021-07-14 LAB — LIPID PANEL
Cholesterol: 152 mg/dL (ref <200)
HDL: 78 mg/dL (ref >=50)
LDL Cholesterol (Calc): 57 mg/dL (calc)
Non-HDL Cholesterol (Calc): 74 mg/dL (calc) (ref <130)
Total CHOL/HDL Ratio: 1.9 (calc) (ref <5.0)
Triglycerides: 83 mg/dL (ref <150)

## 2021-07-14 LAB — CBC
HCT: 41.3 % (ref 35.0–45.0)
Hemoglobin: 13.2 g/dL (ref 11.7–15.5)
MCH: 27.6 pg (ref 27.0–33.0)
MCHC: 32 g/dL (ref 32.0–36.0)
MCV: 86.2 fL (ref 80.0–100.0)
MPV: 10.4 fL (ref 7.5–12.5)
Platelets: 314 10*3/uL (ref 140–400)
RBC: 4.79 10*6/uL (ref 3.80–5.10)
RDW: 14 % (ref 11.0–15.0)
WBC: 7.4 10*3/uL (ref 3.8–10.8)

## 2021-07-14 LAB — B12 AND FOLATE PANEL
Folate: 8.5 ng/mL
Vitamin B-12: 341 pg/mL (ref 200–1100)

## 2021-07-14 LAB — IRON,TIBC AND FERRITIN PANEL
%SAT: 11 % (calc) — ABNORMAL LOW (ref 16–45)
Ferritin: 8 ng/mL — ABNORMAL LOW (ref 16–232)
Iron: 38 ug/dL — ABNORMAL LOW (ref 40–190)
TIBC: 356 mcg/dL (calc) (ref 250–450)

## 2021-07-14 LAB — TSH: TSH: 1.44 mIU/L

## 2021-07-14 NOTE — Assessment & Plan Note (Addendum)
Not yet anemic but iron indices are low, saturation is low, and ferritin levels are low suggesting iron deficiency without overt anemia, B12 is also in the bottom of the normal range, my advice would be to do 1000 mcg of B12 once daily and an iron supplement (325 mg twice daily). ?This may explain some of her fatigue. ?

## 2021-07-15 ENCOUNTER — Telehealth: Payer: Self-pay

## 2021-07-15 ENCOUNTER — Encounter: Payer: Self-pay | Admitting: Sports Medicine

## 2021-07-15 NOTE — Telephone Encounter (Addendum)
Initiated Prior authorization [id number] 2.'4MG'$ /0.75ML auto-injectors ?Via: Covermymeds ?Case/Key: [id number] ?Status: approved  as of 07/15/21 ?Reason:effective from 07/15/2021 through 07/14/2022. ?Notified Pt via: Mychart ?

## 2021-07-15 NOTE — Telephone Encounter (Signed)
Patient has been on wegovy 2.4 for over a year and her PA has expired. She needs a new one.  ?

## 2021-07-16 ENCOUNTER — Telehealth: Payer: BC Managed Care – PPO | Admitting: Physician Assistant

## 2021-07-16 ENCOUNTER — Encounter: Payer: Self-pay | Admitting: Sports Medicine

## 2021-07-16 DIAGNOSIS — R3989 Other symptoms and signs involving the genitourinary system: Secondary | ICD-10-CM | POA: Diagnosis not present

## 2021-07-16 NOTE — Patient Instructions (Addendum)
?Delynn Flavin, thank you for joining Leeanne Rio, PA-C for today's virtual visit.  While this provider is not your primary care provider (PCP), if your PCP is located in our provider database this encounter information will be shared with them immediately following your visit. ? ?Consent: ?(Patient) Rose Brooks provided verbal consent for this virtual visit at the beginning of the encounter. ? ?Current Medications: ? ?Current Outpatient Medications:  ?  ALPRAZolam (XANAX) 0.5 MG tablet, Take 1 tablet by mouth twice daily as needed for anxiety, Disp: 20 tablet, Rfl: 0 ?  buPROPion (WELLBUTRIN XL) 150 MG 24 hr tablet, Take 1 tablet by mouth once daily, Disp: 90 tablet, Rfl: 3 ?  citalopram (CELEXA) 20 MG tablet, Take 1 tablet (20 mg total) by mouth daily., Disp: 90 tablet, Rfl: 0 ?  lisinopril (ZESTRIL) 5 MG tablet, Take 1 tablet by mouth once daily, Disp: 90 tablet, Rfl: 3 ?  metFORMIN (GLUCOPHAGE-XR) 500 MG 24 hr tablet, Take 1 tablet by mouth once daily with breakfast, Disp: 90 tablet, Rfl: 3 ?  nitrofurantoin, macrocrystal-monohydrate, (MACROBID) 100 MG capsule, Take 1 capsule (100 mg total) by mouth 2 (two) times daily., Disp: 14 capsule, Rfl: 0 ?  topiramate (TOPAMAX) 50 MG tablet, Take 1 tablet by mouth twice daily, Disp: 180 tablet, Rfl: 3 ?  WEGOVY 2.4 MG/0.75ML SOAJ, INJECT 2.4 MG INTO THE SKIN  ONCE A WEEK, Disp: 4 mL, Rfl: 0  ? ?Medications ordered in this encounter:  ?No orders of the defined types were placed in this encounter. ?  ? ?*If you need refills on other medications prior to your next appointment, please contact your pharmacy* ? ?Follow-Up: ?Call back or seek an in-person evaluation if the symptoms worsen or if the condition fails to improve as anticipated. ? ?Other Instructions ?Your symptoms are consistent with a bladder infection, also called acute cystitis. Please take your antibiotic (Keflex) as directed until all pills are gone.  Stay very well hydrated.  You can, giving your  history of IC, use OTC Pepcid (Famotidine) daily as well to help reduce inflammation. Consider a daily probiotic (Align, Culturelle, or Activia) to help prevent stomach upset caused by the antibiotic.  Taking a probiotic daily may also help prevent recurrent UTIs.  Also consider taking AZO (Phenazopyridine) tablets to help decrease pain with urination.  I will call you with your urine testing results.  We will change antibiotics if indicated.  Call or return to clinic if symptoms are not resolved by completion of antibiotic.  ? ?Urinary Tract Infection ?A urinary tract infection (UTI) can occur any place along the urinary tract. The tract includes the kidneys, ureters, bladder, and urethra. A type of germ called bacteria often causes a UTI. UTIs are often helped with antibiotic medicine.  ?HOME CARE  ?If given, take antibiotics as told by your doctor. Finish them even if you start to feel better. ?Drink enough fluids to keep your pee (urine) clear or pale yellow. ?Avoid tea, drinks with caffeine, and bubbly (carbonated) drinks. ?Pee often. Avoid holding your pee in for a long time. ?Pee before and after having sex (intercourse). ?Wipe from front to back after you poop (bowel movement) if you are a woman. Use each tissue only once. ?GET HELP RIGHT AWAY IF:  ?You have back pain. ?You have lower belly (abdominal) pain. ?You have chills. ?You feel sick to your stomach (nauseous). ?You throw up (vomit). ?Your burning or discomfort with peeing does not go away. ?You have a  fever. ?Your symptoms are not better in 3 days. ?MAKE SURE YOU:  ?Understand these instructions. ?Will watch your condition. ?Will get help right away if you are not doing well or get worse. ?Document Released: 08/31/2007 Document Revised: 12/07/2011 Document Reviewed: 10/13/2011 ?ExitCare? Patient Information ?2015 [employer]. This information is not intended to replace advice given to you by your health care provider. Make sure you discuss any  questions you have with your health care provider. ? ? ? ?If you have been instructed to have an in-person evaluation today at a local Urgent Care facility, please use the link below. It will take you to a list of all of our available [facility], including address, phone number and hours of operation. Please do not delay care.  ?[facility] ? ?If you or a family member do not have a primary care provider, use the link below to schedule a visit and establish care. When you choose a [facility] primary care physician or advanced practice provider, you gain a long-term partner in health. ?Find a Primary Care Provider ? ?Learn more about [facility]'s in-office and virtual care options: ?[facility]  ?

## 2021-07-16 NOTE — Progress Notes (Signed)
?Virtual Visit Consent  ? ?Rose Brooks, you are scheduled for a virtual visit with a [facility] provider today.   ?  ?Just as with appointments in the office, your consent must be obtained to participate.  Your consent will be active for this visit and any virtual visit you may have with one of our providers in the next 365 days.   ?  ?If you have a MyChart account, a copy of this consent can be sent to you electronically.  All virtual visits are billed to your insurance company just like a traditional visit in the office.   ? ?As this is a virtual visit, video technology does not allow for your provider to perform a traditional examination.  This may limit your provider's ability to fully assess your condition.  If your provider identifies any concerns that need to be evaluated in person or the need to arrange testing (such as labs, EKG, etc.), we will make arrangements to do so.   ?  ?Although advances in technology are sophisticated, we cannot ensure that it will always work on either your end or our end.  If the connection with a video visit is poor, the visit may have to be switched to a telephone visit.  With either a video or telephone visit, we are not always able to ensure that we have a secure connection.    ? ?Also, by engaging in this virtual visit, you consent to the provision of healthcare. Additionally, you authorize for your insurance to be billed (if applicable) for the services provided during this visit.  ? ?I need to obtain your verbal consent now.   Are you willing to proceed with your visit today?  ?  ?Rose Brooks has provided verbal consent on 07/16/2021 for a virtual visit (video or telephone). ?  ?Rose Rio, PA-C  ? ?Date: 07/16/2021 2:24 PM ? ? ?Virtual Visit via Video Note  ? ?[name], connected with  Rose Brooks  ([id number], Sep 25, 1980) on 07/16/21 at  2:15 PM EDT by a video-enabled telemedicine application and verified that I am speaking with the correct  person using two identifiers. ? ?Location: ?Patient: Virtual Visit Location Patient: Home ?Provider: Virtual Visit Location Provider: Home Office ?  ?I discussed the limitations of evaluation and management by telemedicine and the availability of in person appointments. The patient expressed understanding and agreed to proceed.   ? ?History of Present Illness: ?Rose Brooks is a 41 y.o. who identifies as a female who was assigned female at birth, and is being seen today for possible UTI. Notes history of IC with waxing and waning symptoms but every so often will have severe symptoms and blood in urine and requires antibiotic. Denies fever, chills, vomiting, flank pain or abdominal pain. Denies vaginal pain or discharge. Denies concerns of pregnancy -- os s/p BTL.  ? ?HPI: HPI  ?Problems:  ?Patient Active Problem List  ? Diagnosis Date Noted  ? Iron deficiency without anemia 07/14/2021  ? Fracture of fibula, right, closed 07/13/2018  ? Difficulty concentrating 08/04/2016  ? Adjustment disorder with mixed anxiety and depressed mood 04/01/2016  ? Perennial allergic rhinitis 03/15/2016  ? Neck pain 03/15/2016  ? Essential hypertension, benign 12/18/2015  ? Annual physical exam 10/23/2015  ? PCOS (polycystic ovarian syndrome) 10/23/2015  ? Obesity (BMI 30-39.9) 10/23/2015  ?  ?Allergies:  ?Allergies  ?Allergen Reactions  ? Codeine   ? Elemental Sulfur   ? ?Medications:  ?Current  Outpatient Medications:  ?  ALPRAZolam (XANAX) 0.5 MG tablet, Take 1 tablet by mouth twice daily as needed for anxiety, Disp: 20 tablet, Rfl: 0 ?  buPROPion (WELLBUTRIN XL) 150 MG 24 hr tablet, Take 1 tablet by mouth once daily, Disp: 90 tablet, Rfl: 3 ?  citalopram (CELEXA) 20 MG tablet, Take 1 tablet (20 mg total) by mouth daily., Disp: 90 tablet, Rfl: 0 ?  lisinopril (ZESTRIL) 5 MG tablet, Take 1 tablet by mouth once daily, Disp: 90 tablet, Rfl: 3 ?  metFORMIN (GLUCOPHAGE-XR) 500 MG 24 hr tablet, Take 1 tablet by mouth once daily with  breakfast, Disp: 90 tablet, Rfl: 3 ?  topiramate (TOPAMAX) 50 MG tablet, Take 1 tablet by mouth twice daily, Disp: 180 tablet, Rfl: 3 ?  WEGOVY 2.4 MG/0.75ML SOAJ, INJECT 2.4 MG INTO THE SKIN  ONCE A WEEK, Disp: 4 mL, Rfl: 0 ? ?Observations/Objective: ?Patient is well-developed, well-nourished in no acute distress.  ?Resting comfortably at home.  ?Head is normocephalic, atraumatic.  ?No labored breathing. ?Speech is clear and coherent with logical content.  ?Patient is alert and oriented at baseline.  ? ?Assessment and Plan: ?1. Suspected UTI ? ?Increase fluids. Avoid caffeine and spicy foods. Recommend starting OTC Famotidine to help reduce bladder inflammation giving history of IC. Will start Keflex 500 mg BID x 7 days. Strict in-person evaluation precautions reviewed.  ? ?Follow Up Instructions: ?I discussed the assessment and treatment plan with the patient. The patient was provided an opportunity to ask questions and all were answered. The patient agreed with the plan and demonstrated an understanding of the instructions.  A copy of instructions were sent to the patient via MyChart unless otherwise noted below.  ? ?The patient was advised to call back or seek an in-person evaluation if the symptoms worsen or if the condition fails to improve as anticipated. ? ?Time:  ?I spent 10 minutes with the patient via telehealth technology discussing the above problems/concerns.   ? ?Rose Rio, PA-C ?

## 2021-07-28 DIAGNOSIS — Z01419 Encounter for gynecological examination (general) (routine) without abnormal findings: Secondary | ICD-10-CM | POA: Diagnosis not present

## 2021-07-28 DIAGNOSIS — Z124 Encounter for screening for malignant neoplasm of cervix: Secondary | ICD-10-CM | POA: Diagnosis not present

## 2021-07-28 DIAGNOSIS — Z6827 Body mass index (BMI) 27.0-27.9, adult: Secondary | ICD-10-CM | POA: Diagnosis not present

## 2021-07-28 DIAGNOSIS — Z1151 Encounter for screening for human papillomavirus (HPV): Secondary | ICD-10-CM | POA: Diagnosis not present

## 2021-08-04 DIAGNOSIS — Z30430 Encounter for insertion of intrauterine contraceptive device: Secondary | ICD-10-CM | POA: Diagnosis not present

## 2021-08-20 ENCOUNTER — Other Ambulatory Visit: Payer: Self-pay | Admitting: Sports Medicine

## 2021-08-20 DIAGNOSIS — F4323 Adjustment disorder with mixed anxiety and depressed mood: Secondary | ICD-10-CM

## 2021-09-14 ENCOUNTER — Other Ambulatory Visit: Payer: Self-pay | Admitting: Sports Medicine

## 2021-09-15 DIAGNOSIS — N920 Excessive and frequent menstruation with regular cycle: Secondary | ICD-10-CM | POA: Diagnosis not present

## 2021-09-15 DIAGNOSIS — N938 Other specified abnormal uterine and vaginal bleeding: Secondary | ICD-10-CM | POA: Diagnosis not present

## 2021-09-15 DIAGNOSIS — Z30432 Encounter for removal of intrauterine contraceptive device: Secondary | ICD-10-CM | POA: Diagnosis not present

## 2021-10-05 ENCOUNTER — Other Ambulatory Visit: Payer: Self-pay | Admitting: Sports Medicine

## 2021-10-18 ENCOUNTER — Other Ambulatory Visit: Payer: Self-pay | Admitting: Sports Medicine

## 2021-10-18 DIAGNOSIS — R635 Abnormal weight gain: Secondary | ICD-10-CM

## 2021-10-18 DIAGNOSIS — F4323 Adjustment disorder with mixed anxiety and depressed mood: Secondary | ICD-10-CM

## 2021-10-18 DIAGNOSIS — E282 Polycystic ovarian syndrome: Secondary | ICD-10-CM

## 2021-10-27 DIAGNOSIS — N920 Excessive and frequent menstruation with regular cycle: Secondary | ICD-10-CM | POA: Diagnosis not present

## 2021-11-01 ENCOUNTER — Other Ambulatory Visit: Payer: Self-pay | Admitting: Sports Medicine

## 2021-11-17 DIAGNOSIS — N920 Excessive and frequent menstruation with regular cycle: Secondary | ICD-10-CM | POA: Diagnosis not present

## 2021-11-17 DIAGNOSIS — N858 Other specified noninflammatory disorders of uterus: Secondary | ICD-10-CM | POA: Diagnosis not present

## 2021-11-17 DIAGNOSIS — Z3202 Encounter for pregnancy test, result negative: Secondary | ICD-10-CM | POA: Diagnosis not present

## 2022-03-02 ENCOUNTER — Other Ambulatory Visit: Payer: Self-pay | Admitting: Sports Medicine

## 2022-03-02 DIAGNOSIS — F4323 Adjustment disorder with mixed anxiety and depressed mood: Secondary | ICD-10-CM

## 2022-03-02 DIAGNOSIS — E282 Polycystic ovarian syndrome: Secondary | ICD-10-CM

## 2022-03-02 DIAGNOSIS — R635 Abnormal weight gain: Secondary | ICD-10-CM

## 2022-04-08 ENCOUNTER — Encounter: Payer: Self-pay | Admitting: Sports Medicine

## 2022-04-08 ENCOUNTER — Ambulatory Visit (INDEPENDENT_AMBULATORY_CARE_PROVIDER_SITE_OTHER): Payer: BC Managed Care – PPO | Admitting: Sports Medicine

## 2022-04-08 VITALS — BP 168/104 | HR 80

## 2022-04-08 DIAGNOSIS — F4323 Adjustment disorder with mixed anxiety and depressed mood: Secondary | ICD-10-CM

## 2022-04-08 DIAGNOSIS — I10 Essential (primary) hypertension: Secondary | ICD-10-CM

## 2022-04-08 DIAGNOSIS — L719 Rosacea, unspecified: Secondary | ICD-10-CM | POA: Insufficient documentation

## 2022-04-08 DIAGNOSIS — L987 Excessive and redundant skin and subcutaneous tissue: Secondary | ICD-10-CM

## 2022-04-08 DIAGNOSIS — D2372 Other benign neoplasm of skin of left lower limb, including hip: Secondary | ICD-10-CM | POA: Insufficient documentation

## 2022-04-08 MED ORDER — METRONIDAZOLE 0.75 % EX GEL: 1.0000 | Freq: Two times a day (BID) | CUTANEOUS | 3 refills | Status: AC

## 2022-04-08 NOTE — Progress Notes (Addendum)
    Procedures performed today:    None.  Independent interpretation of notes and tests performed by another provider:   None.  Brief History, Exam, Impression, and Recommendations:    Rosacea Pleasant 42 year old female, she has developed a rash on her face, between her eyebrows, above the eyebrows, nose and cheeks. 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 She did get some tretinoin cream from her friend, this seems to only be drying things up, I did advise her that the recommended treatment was topical metronidazole followed by Lawerance Cruel if insufficient improvement, phototherapy can also be used in the future. We will try topical metronidazole with a 24-monthfollow-up.  Dermatofibroma of left thigh Rose Jerseydoes have a rapidly growing lesion left anterior thigh that I think is a dermatofibroma. We will do an excisional biopsy. She is very interested in placing the sutures herself during the procedure.   We will consider this.  Excess skin Rose Jerseyhas done a fantastic job with weight loss, she has been using Wegovy, she has lost almost 100 pounds, there is some excess skin. Her weight has stabilized. She is happy to do a consultation with plastic surgery.  Essential hypertension, benign Blood pressure elevated on initial and recheck, she will restart her lisinopril and send me a blood pressure log in 2 weeks.  Adjustment disorder with mixed anxiety and depressed mood Mood is improved considerably, she is off all antidepressants and only takes an occasional alprazolam.    ____________________________________________ TGwen Her TDianah Field M.D., ABFM., CAQSM., AME. Primary Care and Sports Medicine [facility] Adjunct Professor of [employer] [facility] [employer]  [employer]  [employer] manager

## 2022-04-08 NOTE — Assessment & Plan Note (Signed)
Mood is improved considerably, she is off all antidepressants and only takes an occasional alprazolam.

## 2022-04-08 NOTE — Assessment & Plan Note (Addendum)
Blood pressure elevated on initial and recheck, she will restart her lisinopril and send me a blood pressure log in 2 weeks.

## 2022-04-08 NOTE — Addendum Note (Signed)
Addended by: Silverio Decamp on: 04/08/2022 04:22 PM   Modules accepted: Orders

## 2022-04-08 NOTE — Assessment & Plan Note (Signed)
Pleasant 42 year old female, she has developed a rash on her face, between her eyebrows, above the eyebrows, nose and cheeks. 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 She did get some tretinoin cream from her friend, this seems to only be drying things up, I did advise her that the recommended treatment was topical metronidazole followed by Lawerance Cruel if insufficient improvement, phototherapy can also be used in the future. We will try topical metronidazole with a 28-monthfollow-up.

## 2022-04-08 NOTE — Assessment & Plan Note (Signed)
Rose Brooks has done a fantastic job with weight loss, she has been using Wegovy, she has lost almost 100 pounds, there is some excess skin. Her weight has stabilized. She is happy to do a consultation with plastic surgery.

## 2022-04-08 NOTE — Assessment & Plan Note (Signed)
Rose Brooks does have a rapidly growing lesion left anterior thigh that I think is a dermatofibroma. We will do an excisional biopsy. She is very interested in placing the sutures herself during the procedure.   We will consider this.

## 2022-04-12 ENCOUNTER — Ambulatory Visit: Payer: BC Managed Care – PPO | Admitting: Sports Medicine

## 2022-04-14 ENCOUNTER — Ambulatory Visit: Payer: BC Managed Care – PPO | Admitting: Sports Medicine

## 2022-04-21 ENCOUNTER — Other Ambulatory Visit: Payer: Self-pay | Admitting: Sports Medicine

## 2022-04-21 DIAGNOSIS — F4323 Adjustment disorder with mixed anxiety and depressed mood: Secondary | ICD-10-CM

## 2022-04-25 ENCOUNTER — Other Ambulatory Visit: Payer: Self-pay | Admitting: Sports Medicine

## 2022-05-06 ENCOUNTER — Ambulatory Visit: Payer: BC Managed Care – PPO | Admitting: Sports Medicine

## 2022-05-16 ENCOUNTER — Ambulatory Visit: Payer: BC Managed Care – PPO | Admitting: Sports Medicine

## 2022-05-19 ENCOUNTER — Telehealth: Payer: Self-pay

## 2022-05-19 NOTE — Telephone Encounter (Signed)
She told me that she had come off of the medications I am happy to restart, I see alprazolam, she has been on Celexa/Wellbutrin/Trintellix as well in the past, would she like to start 1 of those?  If there are more anxiety?  More depression?  A combination?  Tough to make the decision through this venue.

## 2022-05-19 NOTE — Telephone Encounter (Signed)
Patient left VM and states she seen you on 04/08/22 and she told you she didn't need to take her anxiety and derepression meds anymore and she states since she been off it she doesn't like her results and would like to go back on it.

## 2022-05-23 NOTE — Telephone Encounter (Signed)
I called and spoke to patient and she states she feels better one it cause she has more energy and she can accepts things better when she is on the meds and she will let me know what she was on when she gets back.

## 2022-06-27 ENCOUNTER — Other Ambulatory Visit: Payer: Self-pay | Admitting: Sports Medicine

## 2022-06-27 DIAGNOSIS — F4323 Adjustment disorder with mixed anxiety and depressed mood: Secondary | ICD-10-CM

## 2022-09-05 ENCOUNTER — Other Ambulatory Visit: Payer: Self-pay | Admitting: Sports Medicine

## 2022-09-05 DIAGNOSIS — F4323 Adjustment disorder with mixed anxiety and depressed mood: Secondary | ICD-10-CM

## 2022-09-15 ENCOUNTER — Other Ambulatory Visit: Payer: Self-pay | Admitting: Sports Medicine

## 2022-09-15 DIAGNOSIS — F4323 Adjustment disorder with mixed anxiety and depressed mood: Secondary | ICD-10-CM

## 2022-09-15 DIAGNOSIS — E282 Polycystic ovarian syndrome: Secondary | ICD-10-CM

## 2022-09-15 DIAGNOSIS — I10 Essential (primary) hypertension: Secondary | ICD-10-CM

## 2022-09-15 DIAGNOSIS — R635 Abnormal weight gain: Secondary | ICD-10-CM

## 2022-10-20 ENCOUNTER — Other Ambulatory Visit: Payer: Self-pay | Admitting: Sports Medicine

## 2022-10-20 DIAGNOSIS — F4323 Adjustment disorder with mixed anxiety and depressed mood: Secondary | ICD-10-CM

## 2022-12-13 ENCOUNTER — Other Ambulatory Visit: Payer: Self-pay | Admitting: Sports Medicine

## 2022-12-13 DIAGNOSIS — F4323 Adjustment disorder with mixed anxiety and depressed mood: Secondary | ICD-10-CM

## 2023-03-14 ENCOUNTER — Other Ambulatory Visit: Payer: Self-pay | Admitting: Sports Medicine

## 2023-03-14 DIAGNOSIS — F4323 Adjustment disorder with mixed anxiety and depressed mood: Secondary | ICD-10-CM

## 2023-04-27 ENCOUNTER — Other Ambulatory Visit: Payer: Self-pay | Admitting: Sports Medicine

## 2023-05-24 ENCOUNTER — Telehealth: Payer: Self-pay | Admitting: Sports Medicine

## 2023-05-24 NOTE — Telephone Encounter (Signed)
 Copied from CRM [id number]. Topic: General - Other >> May 23, 2023  5:32 PM Antony Haste wrote: Reason for CRM: PT states she was informed to leave a message for Dr.T's nurses, advised the office hours. Informed Baya a nurse will follow-up tomorrow morning.  Callback [phone]

## 2023-05-25 NOTE — Telephone Encounter (Signed)
 Patient states already spoke with a nurse regarding her message.

## 2023-05-26 ENCOUNTER — Telehealth: Payer: Self-pay

## 2023-05-26 NOTE — Telephone Encounter (Signed)
 Copied from CRM [id number]. Topic: General - Other >> May 26, 2023  1:23 PM Nila Nephew wrote: Reason for CRM: Patient calling to inquire about PA request made on 02/26. Advised patient that requests can take time to process but that a request has been sent. Request is for PA of Wegovy.

## 2023-06-02 NOTE — Telephone Encounter (Signed)
 LVM letting the patient know that the PA is in process.

## 2023-06-06 ENCOUNTER — Encounter (INDEPENDENT_AMBULATORY_CARE_PROVIDER_SITE_OTHER): Payer: Self-pay | Admitting: Sports Medicine

## 2023-06-06 ENCOUNTER — Encounter: Payer: Self-pay | Admitting: Sports Medicine

## 2023-06-06 ENCOUNTER — Telehealth: Payer: Self-pay

## 2023-06-06 ENCOUNTER — Ambulatory Visit: Payer: BC Managed Care – PPO | Admitting: Sports Medicine

## 2023-06-06 VITALS — BP 156/101 | HR 68 | Resp 20 | Ht 60.0 in

## 2023-06-06 DIAGNOSIS — E282 Polycystic ovarian syndrome: Secondary | ICD-10-CM

## 2023-06-06 DIAGNOSIS — I10 Essential (primary) hypertension: Secondary | ICD-10-CM

## 2023-06-06 DIAGNOSIS — F4323 Adjustment disorder with mixed anxiety and depressed mood: Secondary | ICD-10-CM | POA: Diagnosis not present

## 2023-06-06 DIAGNOSIS — Z0000 Encounter for general adult medical examination without abnormal findings: Secondary | ICD-10-CM

## 2023-06-06 DIAGNOSIS — E669 Obesity, unspecified: Secondary | ICD-10-CM

## 2023-06-06 MED ORDER — BUPROPION HCL ER (XL) 150 MG PO TB24
150.0000 mg | ORAL_TABLET | ORAL | 3 refills | Status: AC
Start: 2023-06-06 — End: ?

## 2023-06-06 MED ORDER — CITALOPRAM HYDROBROMIDE 10 MG PO TABS
10.0000 mg | ORAL_TABLET | Freq: Every day | ORAL | 3 refills | Status: DC
Start: 2023-06-06 — End: 2023-08-07

## 2023-06-06 MED ORDER — ALPRAZOLAM 0.5 MG PO TABS: 0.5000 mg | ORAL_TABLET | Freq: Two times a day (BID) | ORAL | 0 refills | Status: DC | PRN

## 2023-06-06 MED ORDER — VALSARTAN 80 MG PO TABS
80.0000 mg | ORAL_TABLET | Freq: Every day | ORAL | 3 refills | Status: DC
Start: 2023-06-06 — End: 2023-08-07

## 2023-06-06 NOTE — Assessment & Plan Note (Signed)
 Rose Brooks has done really well and Wegovy, she historically lost over 100 pounds. [name] lost coverage, she has been getting the run around with regards to insurance approval and documentation submitted. We will escalate this.  For insurance purposes she will be on a multidisciplinary weight loss program with calorie counting, and exercise prescription, she will not be on any other weight loss medications and she has no contraindications to GLP-1s. If unable to get branded Wegovy we will try compounded semaglutide.

## 2023-06-06 NOTE — Assessment & Plan Note (Signed)
 Due for cervical cancer screening, we will get her in with Joy.

## 2023-06-06 NOTE — Progress Notes (Signed)
    Procedures performed today:    None.  Independent interpretation of notes and tests performed by another provider:   None.  Brief History, Exam, Impression, and Recommendations:    Adjustment disorder with mixed anxiety and depressed mood Rose Brooks mood was doing really well, she then discontinued all of her antidepressants, her mood has worsened. Restarting Celexa and Wellbutrin low-dose, we will do a 4-week follow-up for dose titration.  Essential hypertension, benign Blood pressure is elevated, we will restart blood pressure medication in the form of valsartan. Rose Brooks will do home blood pressure logs and send them to me on MyChart in 2 weeks.  Annual physical exam Due for cervical cancer screening, we will get her in with Joy.  Obesity (BMI 30-39.9) Rose Brooks has done really well and Wegovy, she historically lost over 100 pounds. [name] lost coverage, she has been getting the run around with regards to insurance approval and documentation submitted. We will escalate this.  For insurance purposes she will be on a multidisciplinary weight loss program with calorie counting, and exercise prescription, she will not be on any other weight loss medications and she has no contraindications to GLP-1s. If unable to get branded Wegovy we will try compounded semaglutide.    ____________________________________________ Ihor [place]. Benjamin Stain, M.D., ABFM., CAQSM., AME. Primary Care and Sports Medicine [facility]  Adjunct Professor of Family Medicine  [place] [employer]  Restaurant manager, fast food

## 2023-06-06 NOTE — Telephone Encounter (Signed)
 I think this patient is getting the run around, on the 3/7 update Toyna said the PA was in progress, she called back yesterday and was told nothing was in progress, and of course nothing documented.  Please escalate this to someone who can get the job done.  This process has been ongoing since January and this is ridiculous.

## 2023-06-06 NOTE — Telephone Encounter (Signed)
 Message routed to [facility] for assistance with PA.

## 2023-06-06 NOTE — Assessment & Plan Note (Signed)
 Rose Brooks's mood was doing really well, she then discontinued all of her antidepressants, her mood has worsened. Restarting Celexa and Wellbutrin low-dose, we will do a 4-week follow-up for dose titration.

## 2023-06-06 NOTE — Assessment & Plan Note (Signed)
 Blood pressure is elevated, we will restart blood pressure medication in the form of valsartan. Rose Brooks will do home blood pressure logs and send them to me on MyChart in 2 weeks.

## 2023-06-06 NOTE — Telephone Encounter (Unsigned)
 Copied from CRM [id number]. Topic: Clinical - Prescription Issue >> Jun 05, 2023  1:36 PM Nila Nephew wrote: Reason for CRM: Patient is calling in to request an update on PA for [facility]. Patient feels she is being given the run around and is not being addressed. Patient has bene out of her medication for over a month. Please follow up with patient.

## 2023-06-07 LAB — HEMOGLOBIN A1C
Est. average glucose Bld gHb Est-mCnc: 97 mg/dL
Hgb A1c MFr Bld: 5 % (ref 4.8–5.6)

## 2023-06-07 LAB — COMPREHENSIVE METABOLIC PANEL
ALT: 16 IU/L (ref 0–32)
AST: 22 IU/L (ref 0–40)
Albumin: 4.3 g/dL (ref 3.9–4.9)
Alkaline Phosphatase: 56 IU/L (ref 44–121)
BUN/Creatinine Ratio: 13 (ref 9–23)
BUN: 10 mg/dL (ref 6–24)
Bilirubin Total: 0.9 mg/dL (ref 0.0–1.2)
CO2: 23 mmol/L (ref 20–29)
Calcium: 9 mg/dL (ref 8.7–10.2)
Chloride: 104 mmol/L (ref 96–106)
Creatinine, Ser: 0.76 mg/dL (ref 0.57–1.00)
Globulin, Total: 2.1 g/dL (ref 1.5–4.5)
Glucose: 99 mg/dL (ref 70–99)
Potassium: 4.1 mmol/L (ref 3.5–5.2)
Sodium: 142 mmol/L (ref 134–144)
Total Protein: 6.4 g/dL (ref 6.0–8.5)
eGFR: 98 mL/min/{1.73_m2} (ref >59)

## 2023-06-07 LAB — CBC
Hematocrit: 41.5 % (ref 34.0–46.6)
Hemoglobin: 13.7 g/dL (ref 11.1–15.9)
MCH: 29.5 pg (ref 26.6–33.0)
MCHC: 33 g/dL (ref 31.5–35.7)
MCV: 89 fL (ref 79–97)
Platelets: 281 10*3/uL (ref 150–450)
RBC: 4.64 x10E6/uL (ref 3.77–5.28)
RDW: 13.1 % (ref 11.7–15.4)
WBC: 6 10*3/uL (ref 3.4–10.8)

## 2023-06-07 LAB — LIPID PANEL
Chol/HDL Ratio: 1.8 ratio (ref 0.0–4.4)
Cholesterol, Total: 153 mg/dL (ref 100–199)
HDL: 83 mg/dL (ref >39)
LDL Chol Calc (NIH): 58 mg/dL (ref 0–99)
Triglycerides: 57 mg/dL (ref 0–149)
VLDL Cholesterol Cal: 12 mg/dL (ref 5–40)

## 2023-06-07 LAB — TSH: TSH: 1.06 u[IU]/mL (ref 0.450–4.500)

## 2023-06-08 MED ORDER — METFORMIN HCL ER 500 MG PO TB24
500.0000 mg | ORAL_TABLET | Freq: Every day | ORAL | 3 refills | Status: AC
Start: 2023-06-08 — End: ?

## 2023-06-08 NOTE — Telephone Encounter (Signed)

## 2023-06-09 NOTE — Telephone Encounter (Signed)
 Prior auth for: [facility] Determination: Pending as of 06/08/23 Auth #: [id number] Valid from: n/a Patient notified via MyChart

## 2023-06-09 NOTE — Telephone Encounter (Signed)
 Left vm with patient to let her know that PA has been submitted to insurance and we are waiting for the determination. I will give her a call as soon as the know the status. AL 06/09/23

## 2023-06-09 NOTE — Telephone Encounter (Signed)
 Prior authorization is still pending.

## 2023-06-19 NOTE — Telephone Encounter (Signed)
 Prior auth for: [facility] Determination: APPROVED Auth #: [id number] Valid from: 06/14/23 - 3/189/26 Patient notified via MyChart

## 2023-06-22 ENCOUNTER — Other Ambulatory Visit (HOSPITAL_COMMUNITY): Payer: Self-pay

## 2023-06-22 ENCOUNTER — Telehealth: Payer: Self-pay

## 2023-06-27 ENCOUNTER — Other Ambulatory Visit (HOSPITAL_COMMUNITY): Payer: Self-pay

## 2023-06-27 NOTE — Telephone Encounter (Signed)
 Approved see letter in media tab.

## 2023-07-04 ENCOUNTER — Ambulatory Visit: Admitting: Sports Medicine

## 2023-07-23 ENCOUNTER — Other Ambulatory Visit: Payer: Self-pay | Admitting: Sports Medicine

## 2023-07-23 DIAGNOSIS — F4323 Adjustment disorder with mixed anxiety and depressed mood: Secondary | ICD-10-CM

## 2023-08-06 DIAGNOSIS — N342 Other urethritis: Secondary | ICD-10-CM | POA: Diagnosis not present

## 2023-08-07 ENCOUNTER — Ambulatory Visit (INDEPENDENT_AMBULATORY_CARE_PROVIDER_SITE_OTHER): Admitting: Sports Medicine

## 2023-08-07 ENCOUNTER — Ambulatory Visit: Payer: Self-pay

## 2023-08-07 ENCOUNTER — Encounter: Payer: Self-pay | Admitting: Sports Medicine

## 2023-08-07 VITALS — BP 164/103 | HR 76 | Resp 20 | Ht 60.0 in | Wt 132.0 lb

## 2023-08-07 DIAGNOSIS — R300 Dysuria: Secondary | ICD-10-CM

## 2023-08-07 DIAGNOSIS — F4323 Adjustment disorder with mixed anxiety and depressed mood: Secondary | ICD-10-CM

## 2023-08-07 DIAGNOSIS — Z1211 Encounter for screening for malignant neoplasm of colon: Secondary | ICD-10-CM

## 2023-08-07 DIAGNOSIS — I10 Essential (primary) hypertension: Secondary | ICD-10-CM

## 2023-08-07 DIAGNOSIS — Z0000 Encounter for general adult medical examination without abnormal findings: Secondary | ICD-10-CM

## 2023-08-07 LAB — POCT URINALYSIS DIP (CLINITEK)
Bilirubin, UA: NEGATIVE
Blood, UA: NEGATIVE
Glucose, UA: NEGATIVE mg/dL
Ketones, POC UA: NEGATIVE mg/dL
Nitrite, UA: NEGATIVE
POC PROTEIN,UA: NEGATIVE
Spec Grav, UA: 1.015 (ref 1.010–1.025)
Urobilinogen, UA: 0.2 U/dL
pH, UA: 5.5 (ref 5.0–8.0)

## 2023-08-07 MED ORDER — CITALOPRAM HYDROBROMIDE 20 MG PO TABS: 20.0000 mg | ORAL_TABLET | Freq: Every day | ORAL | 11 refills | Status: AC

## 2023-08-07 MED ORDER — CEFTRIAXONE SODIUM 1 G IJ SOLR
1.0000 g | Freq: Once | INTRAMUSCULAR | Status: AC
  Administered 2023-08-07: 1 g via INTRAMUSCULAR

## 2023-08-07 MED ORDER — VALSARTAN 160 MG PO TABS
160.0000 mg | ORAL_TABLET | Freq: Every day | ORAL | 11 refills | Status: AC
Start: 2023-08-07 — End: ?

## 2023-08-07 MED ORDER — AZITHROMYCIN 1 G PO PACK
1.0000 g | PACK | Freq: Once | ORAL | Status: AC
  Administered 2023-08-07: 1 g via ORAL

## 2023-08-07 NOTE — Addendum Note (Signed)
 Addended by: Montgomery Apgar on: 08/07/2023 10:54 AM   Modules accepted: Orders

## 2023-08-07 NOTE — Assessment & Plan Note (Signed)
 Does have an appointment for cervical cancer screening with [facility]. Adding Cologuard testing. Tetanus will be due in 2028.

## 2023-08-07 NOTE — Assessment & Plan Note (Signed)
 Exposure to confirmed case of chlamydia, now with dysuria. Mild suprapubic pain. No constitutional symptoms. We will get a gonorrhea and chlamydia test, urinalysis. Due to the confirmed exposure we will treat her with azithromycin  slurry and Rocephin 250. Return in 2 weeks for test of cure if positive.  Update:  Urine culture, gonorrhea, chlamydia testing, trichomonas testing all negative, Ureaplasma did come back positive, this can can be a sexually transmitted process but typically is not.  We can treat this with doxycycline.

## 2023-08-07 NOTE — Patient Instructions (Signed)
 Increase valsartan  to 160 and send me a 2-week blood pressure log on MyChart  6-week follow-up for anxiety on new dose.  If GC test positive return in a nurse visit 2 weeks for test of cure.  Azithromycin  1 g slurry oral, Rocephin 250 mg intramuscular shot today.

## 2023-08-07 NOTE — Assessment & Plan Note (Signed)
 Anxiety is a predominant symptom now. Anxiety not controlled, increasing Celexa  to 20, continue Wellbutrin .

## 2023-08-07 NOTE — Telephone Encounter (Signed)
 This request has been handled. No further action is required. Patient had an appointment with provider earlier today for UTI concerns.

## 2023-08-07 NOTE — Progress Notes (Addendum)
    Procedures performed today:    None.  Independent interpretation of notes and tests performed by another provider:   None.  Brief History, Exam, Impression, and Recommendations:    Dysuria Exposure to confirmed case of chlamydia, now with dysuria. Mild suprapubic pain. No constitutional symptoms. We will get a gonorrhea and chlamydia test, urinalysis. Due to the confirmed exposure we will treat her with azithromycin  slurry and Rocephin 250. Return in 2 weeks for test of cure if positive.  Update:  Urine culture, gonorrhea, chlamydia testing, trichomonas testing all negative, Ureaplasma did come back positive, this can can be a sexually transmitted process but typically is not.  We can treat this with doxycycline.  Essential hypertension, benign Persistently elevated, increasing valsartan  to 160, she will send me a 2-week blood pressure log. Also pulling the trigger for home sleep study.  Adjustment disorder with mixed anxiety and depressed mood Anxiety is a predominant symptom now. Anxiety not controlled, increasing Celexa  to 20, continue Wellbutrin .   Annual physical exam Does have an appointment for cervical cancer screening with [facility]. Adding Cologuard testing. Tetanus will be due in 2028.    ____________________________________________ Joselyn Nicely. Sandy Crumb, M.D., ABFM., CAQSM., AME. Primary Care and Sports Medicine [facility]  Adjunct Professor of [employer] Medicine  [employer]  Restaurant manager, [employer]

## 2023-08-07 NOTE — Assessment & Plan Note (Signed)
 Persistently elevated, increasing valsartan  to 160, she will send me a 2-week blood pressure log. Also pulling the trigger for home sleep study.

## 2023-08-07 NOTE — Telephone Encounter (Signed)
 Chief Complaint: UTI/Back pain Symptoms: burning with urination, blood in urine, back pain Frequency: since Saturday Pertinent Negatives: Patient denies fever Disposition: [] ED /[] Urgent Care (no appt availability in office) / [x] Appointment(In office/virtual)/ []  [facility]/ [] Home Care/ [] Refused Recommended Disposition /[] [facility]/ []  Follow-up with PCP Additional Notes: Patient called in stating she has been experiencing symptoms of a UTI (burning with urination, flank pain, mild blood in urine). This RN passed to CAL to proceed with scheduling.    Copied from CRM [id number]. Topic: Clinical - Red Word Triage >> Aug 07, 2023  8:53 AM Retta Caster wrote: Red Word that prompted transfer to Nurse Triage: UTI infection just statrted over the weekend/Pain in lower back on 05/11/Burning when uranating Reason for Disposition  Side (flank) or lower back pain present  Answer Assessment - Initial Assessment Questions 1. SYMPTOM: "What's the main symptom you're concerned about?" (e.g., frequency, incontinence)     Back pain, UTI 2. ONSET: "When did the  symptoms  start?"     Saturday 3. PAIN: "Is there any pain?" If Yes, ask: "How bad is it?" (Scale: 1-10; mild, moderate, severe)     'pretty bad'  4. CAUSE: "What do you think is causing the symptoms?"     UTI 5. OTHER SYMPTOMS: "Do you have any other symptoms?" (e.g., blood in urine, fever, flank pain, pain with urination)     Left flank pain, pain with urination, blood in urine  Protocols used: Urinary Symptoms-A-AH

## 2023-08-08 ENCOUNTER — Telehealth: Payer: Self-pay

## 2023-08-08 ENCOUNTER — Ambulatory Visit: Payer: Self-pay | Admitting: Sports Medicine

## 2023-08-08 LAB — HCV INTERPRETATION

## 2023-08-08 LAB — ACUTE VIRAL HEPATITIS (HAV, HBV, HCV)
HCV Ab: NONREACTIVE
Hep A IgM: NEGATIVE
Hep B C IgM: NEGATIVE
Hepatitis B Surface Ag: NEGATIVE

## 2023-08-08 LAB — RPR: RPR Ser Ql: NONREACTIVE

## 2023-08-08 LAB — HIV ANTIBODY (ROUTINE TESTING W REFLEX): HIV Screen 4th Generation wRfx: NONREACTIVE

## 2023-08-08 NOTE — Telephone Encounter (Signed)
 Copied from CRM [id number]. Topic: Clinical - Lab/Test Results >> Aug 08, 2023  4:19 PM Danelle Dunning F wrote: Reason for CRM:   Patient called after missing a call from the nurse; Agent read off recent results and informed the patient we are still awaiting the remaining test results to come in; Agent gave the patient about a four day turn around for remaining results and to keep an eye on Mychart for updates. Patient did want to let the office know she called back, she has no questions at this time.   Callback Number: [phone]

## 2023-08-08 NOTE — Progress Notes (Signed)
 Patient informed of results by Nicholette Barley - E2C2 agent

## 2023-08-08 NOTE — Telephone Encounter (Signed)
 Patient informed of results by Nicholette Barley with [facility]

## 2023-08-09 LAB — CT/GC/TV NAA+MYCOPLASMAS URINE
Chlamydia trachomatis, NAA: NEGATIVE
Mycoplasma genitalium NAA: POSITIVE — AB
Mycoplasma hominis NAA: NEGATIVE
Neisseria gonorrhoeae, NAA: NEGATIVE
Trich vag by NAA: NEGATIVE
Ureaplasma spp NAA: POSITIVE — AB

## 2023-08-09 LAB — URINE CULTURE

## 2023-08-09 MED ORDER — DOXYCYCLINE HYCLATE 100 MG PO TABS: 100.0000 mg | ORAL_TABLET | Freq: Two times a day (BID) | ORAL | 0 refills | Status: AC

## 2023-08-09 NOTE — Addendum Note (Signed)
 Addended by: Gean Keels on: 08/09/2023 12:07 PM   Modules accepted: Orders

## 2023-08-14 ENCOUNTER — Encounter (INDEPENDENT_AMBULATORY_CARE_PROVIDER_SITE_OTHER): Payer: Self-pay | Admitting: Sports Medicine

## 2023-08-14 DIAGNOSIS — H04302 Unspecified dacryocystitis of left lacrimal passage: Secondary | ICD-10-CM

## 2023-08-15 DIAGNOSIS — H04302 Unspecified dacryocystitis of left lacrimal passage: Secondary | ICD-10-CM | POA: Insufficient documentation

## 2023-08-15 MED ORDER — AMOXICILLIN-POT CLAVULANATE 875-125 MG PO TABS: 1.0000 | ORAL_TABLET | Freq: Two times a day (BID) | ORAL | 0 refills | Status: AC

## 2023-08-15 NOTE — Telephone Encounter (Signed)

## 2023-08-15 NOTE — Assessment & Plan Note (Signed)
 This does appear to be potentially an infection in the tear ducts called dacryocystitis.  We will start with Augmentin  for 10 days.  If this does not fully resolve the process we will need ophthalmology to weigh in.

## 2023-08-16 NOTE — Telephone Encounter (Signed)
 Ill call, she still needs it.

## 2023-08-16 NOTE — Telephone Encounter (Signed)
 Copied from CRM [id number]. Topic: Clinical - Medical Advice >> Aug 16, 2023 12:42 PM Rose Brooks wrote: Reason for CRM: [facility] with [facility] called and verified the correct number for the patient. They tried to called the patient twice, and she told them that they had the wrong number and disconnected. Rose Brooks tried to call again and the patient disconnected. The patient started the registration, but did not finish. Rose Brooks is asking if the patient still needs the sleep study. If she doesn't she can take her off of the their call log. [phone].

## 2023-09-15 ENCOUNTER — Other Ambulatory Visit: Payer: Self-pay | Admitting: Sports Medicine

## 2023-09-15 DIAGNOSIS — F4323 Adjustment disorder with mixed anxiety and depressed mood: Secondary | ICD-10-CM

## 2023-09-19 DIAGNOSIS — G4714 Hypersomnia due to medical condition: Secondary | ICD-10-CM | POA: Diagnosis not present

## 2023-11-28 ENCOUNTER — Encounter: Payer: Self-pay | Admitting: Sports Medicine

## 2023-12-29 DIAGNOSIS — F4320 Adjustment disorder, unspecified: Secondary | ICD-10-CM | POA: Diagnosis not present

## 2023-12-29 DIAGNOSIS — Z79899 Other long term (current) drug therapy: Secondary | ICD-10-CM | POA: Diagnosis not present

## 2023-12-29 DIAGNOSIS — I10 Essential (primary) hypertension: Secondary | ICD-10-CM | POA: Diagnosis not present

## 2023-12-29 DIAGNOSIS — Z885 Allergy status to narcotic agent status: Secondary | ICD-10-CM | POA: Diagnosis not present

## 2023-12-29 DIAGNOSIS — Z882 Allergy status to sulfonamides status: Secondary | ICD-10-CM | POA: Diagnosis not present

## 2023-12-29 DIAGNOSIS — I2119 ST elevation (STEMI) myocardial infarction involving other coronary artery of inferior wall: Secondary | ICD-10-CM | POA: Diagnosis not present

## 2023-12-29 DIAGNOSIS — R079 Chest pain, unspecified: Secondary | ICD-10-CM | POA: Diagnosis not present

## 2023-12-29 DIAGNOSIS — I214 Non-ST elevation (NSTEMI) myocardial infarction: Secondary | ICD-10-CM | POA: Diagnosis not present

## 2023-12-29 DIAGNOSIS — I213 ST elevation (STEMI) myocardial infarction of unspecified site: Secondary | ICD-10-CM | POA: Diagnosis not present

## 2023-12-29 DIAGNOSIS — I2510 Atherosclerotic heart disease of native coronary artery without angina pectoris: Secondary | ICD-10-CM | POA: Diagnosis not present

## 2023-12-29 DIAGNOSIS — Z6825 Body mass index (BMI) 25.0-25.9, adult: Secondary | ICD-10-CM | POA: Diagnosis not present

## 2023-12-29 DIAGNOSIS — E669 Obesity, unspecified: Secondary | ICD-10-CM | POA: Diagnosis not present

## 2023-12-29 DIAGNOSIS — R0789 Other chest pain: Secondary | ICD-10-CM | POA: Diagnosis not present

## 2023-12-29 DIAGNOSIS — E119 Type 2 diabetes mellitus without complications: Secondary | ICD-10-CM | POA: Diagnosis not present

## 2023-12-29 DIAGNOSIS — I2542 Coronary artery dissection: Secondary | ICD-10-CM | POA: Diagnosis not present

## 2024-01-22 DIAGNOSIS — I10 Essential (primary) hypertension: Secondary | ICD-10-CM | POA: Diagnosis not present

## 2024-01-22 DIAGNOSIS — I2542 Coronary artery dissection: Secondary | ICD-10-CM | POA: Diagnosis not present

## 2024-01-24 DIAGNOSIS — M792 Neuralgia and neuritis, unspecified: Secondary | ICD-10-CM | POA: Diagnosis not present

## 2024-01-24 DIAGNOSIS — M79621 Pain in right upper arm: Secondary | ICD-10-CM | POA: Diagnosis not present

## 2024-02-19 ENCOUNTER — Other Ambulatory Visit: Payer: Self-pay | Admitting: Urgent Care

## 2024-02-19 DIAGNOSIS — F4323 Adjustment disorder with mixed anxiety and depressed mood: Secondary | ICD-10-CM

## 2024-02-19 MED ORDER — ALPRAZOLAM 0.5 MG PO TABS: 0.5000 mg | ORAL_TABLET | Freq: Two times a day (BID) | ORAL | 0 refills | Status: DC | PRN

## 2024-02-19 NOTE — Progress Notes (Signed)
 Pt requested refill of xanax . Will call in to pharmacy. Pt will need appointment for any future refills.

## 2024-04-10 ENCOUNTER — Other Ambulatory Visit: Payer: Self-pay | Admitting: Urgent Care

## 2024-04-10 DIAGNOSIS — F4323 Adjustment disorder with mixed anxiety and depressed mood: Secondary | ICD-10-CM

## 2024-04-18 ENCOUNTER — Other Ambulatory Visit: Payer: Self-pay | Admitting: Urgent Care

## 2024-04-18 DIAGNOSIS — F4323 Adjustment disorder with mixed anxiety and depressed mood: Secondary | ICD-10-CM

## 2024-04-18 NOTE — Telephone Encounter (Signed)
 Patient needs to be scheduled with another PCP.  Being that we are prescribing a benzodiazepine for her pretty regularly she needs an appointment before we can prescribe it again I did go ahead and send in a 30-day supply so that we have several weeks to try to accommodate getting her a transition of care appointment here in our office

## 2024-04-19 NOTE — Telephone Encounter (Signed)
 Contacted patient left vm to call back and schedule an appointment with a new provider
